# Patient Record
Sex: Female | Born: 2000 | Race: Black or African American | Hispanic: No | Marital: Single | Smoking: Former smoker
Health system: Southern US, Community
[De-identification: ages and names within clinical notes are randomized; demographics above are authoritative.]

## PROBLEM LIST (undated history)

## (undated) DIAGNOSIS — R519 Headache, unspecified: Secondary | ICD-10-CM

## (undated) DIAGNOSIS — O139 Gestational [pregnancy-induced] hypertension without significant proteinuria, unspecified trimester: Secondary | ICD-10-CM

## (undated) DIAGNOSIS — B999 Unspecified infectious disease: Secondary | ICD-10-CM

## (undated) DIAGNOSIS — J069 Acute upper respiratory infection, unspecified: Secondary | ICD-10-CM

## (undated) DIAGNOSIS — O1494 Unspecified pre-eclampsia, complicating childbirth: Secondary | ICD-10-CM

## (undated) HISTORY — DX: Unspecified infectious disease: B99.9

## (undated) HISTORY — DX: Headache, unspecified: R51.9

## (undated) HISTORY — DX: Gestational (pregnancy-induced) hypertension without significant proteinuria, unspecified trimester: O13.9

## (undated) HISTORY — DX: Acute upper respiratory infection, unspecified: J06.9

---
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
## 2018-09-12 ENCOUNTER — Other Ambulatory Visit: Payer: Self-pay | Admitting: Family Medicine

## 2018-09-12 DIAGNOSIS — O99210 Obesity complicating pregnancy, unspecified trimester: Secondary | ICD-10-CM | POA: Insufficient documentation

## 2018-09-12 DIAGNOSIS — O99330 Smoking (tobacco) complicating pregnancy, unspecified trimester: Secondary | ICD-10-CM | POA: Insufficient documentation

## 2018-09-12 DIAGNOSIS — Z3400 Encounter for supervision of normal first pregnancy, unspecified trimester: Secondary | ICD-10-CM | POA: Insufficient documentation

## 2018-09-12 DIAGNOSIS — Z3401 Encounter for supervision of normal first pregnancy, first trimester: Secondary | ICD-10-CM

## 2018-09-12 LAB — OB RESULTS CONSOLE HGB/HCT, BLOOD
HCT: 36 (ref 29–41)
Hemoglobin: 12.4

## 2018-09-12 LAB — SICKLE CELL SCREEN: Sickle Cell Screen: NEGATIVE

## 2018-09-12 LAB — OB RESULTS CONSOLE PLATELET COUNT: Platelets: 361

## 2018-09-12 LAB — OB RESULTS CONSOLE RUBELLA ANTIBODY, IGM: Rubella: IMMUNE

## 2018-09-12 LAB — OB RESULTS CONSOLE HIV ANTIBODY (ROUTINE TESTING): HIV: NONREACTIVE

## 2018-09-12 LAB — OB RESULTS CONSOLE VARICELLA ZOSTER ANTIBODY, IGG: Varicella: NON-IMMUNE/NOT IMMUNE

## 2018-09-13 DIAGNOSIS — O99810 Abnormal glucose complicating pregnancy: Secondary | ICD-10-CM | POA: Insufficient documentation

## 2018-09-13 DIAGNOSIS — O09899 Supervision of other high risk pregnancies, unspecified trimester: Secondary | ICD-10-CM | POA: Insufficient documentation

## 2018-09-13 LAB — OB RESULTS CONSOLE ANTIBODY SCREEN: Antibody Screen: NEGATIVE

## 2018-09-13 LAB — BASIC METABOLIC PANEL: Creatinine: 0.7 (ref <=1.1)

## 2018-09-13 LAB — OB RESULTS CONSOLE ABO/RH: RH Type: POSITIVE

## 2018-09-13 LAB — HEPATIC FUNCTION PANEL: AST: 18 (ref 13–35)

## 2018-09-13 LAB — OB RESULTS CONSOLE RPR: RPR: NONREACTIVE

## 2018-09-13 LAB — OB RESULTS CONSOLE TSH: TSH: 0.464

## 2018-09-13 LAB — OB RESULTS CONSOLE HEPATITIS B SURFACE ANTIGEN: Hepatitis B Surface Ag: NEGATIVE

## 2018-09-18 ENCOUNTER — Ambulatory Visit
Admission: RE | Admit: 2018-09-18 | Discharge: 2018-09-18 | Disposition: A | Discharge status: Home / Self Care | Payer: Medicaid Other | Source: Ambulatory Visit | Attending: Family Medicine | Admitting: Family Medicine

## 2018-09-18 DIAGNOSIS — Z3401 Encounter for supervision of normal first pregnancy, first trimester: Secondary | ICD-10-CM | POA: Diagnosis present

## 2019-02-19 DIAGNOSIS — O99810 Abnormal glucose complicating pregnancy: Secondary | ICD-10-CM

## 2019-02-19 DIAGNOSIS — O09899 Supervision of other high risk pregnancies, unspecified trimester: Secondary | ICD-10-CM

## 2019-02-19 DIAGNOSIS — Z3400 Encounter for supervision of normal first pregnancy, unspecified trimester: Secondary | ICD-10-CM

## 2019-02-19 DIAGNOSIS — O99210 Obesity complicating pregnancy, unspecified trimester: Secondary | ICD-10-CM

## 2019-02-19 DIAGNOSIS — O99330 Smoking (tobacco) complicating pregnancy, unspecified trimester: Secondary | ICD-10-CM

## 2019-02-19 DIAGNOSIS — Z2839 Other underimmunization status: Secondary | ICD-10-CM

## 2019-02-19 LAB — GLUCOSE, 1 HOUR GESTATIONAL: Gestational Diabetes Screen: 136

## 2019-09-26 DIAGNOSIS — Z3490 Encounter for supervision of normal pregnancy, unspecified, unspecified trimester: Secondary | ICD-10-CM | POA: Insufficient documentation

## 2019-09-29 ENCOUNTER — Other Ambulatory Visit: Payer: Self-pay | Admitting: Certified Nurse Midwife

## 2019-09-29 DIAGNOSIS — Z369 Encounter for antenatal screening, unspecified: Secondary | ICD-10-CM

## 2019-10-02 ENCOUNTER — Ambulatory Visit
Admission: RE | Admit: 2019-10-02 | Discharge: 2019-10-02 | Disposition: A | Discharge status: Home / Self Care | Payer: Medicaid Other | Source: Ambulatory Visit | Attending: Maternal and Fetal Medicine | Admitting: Maternal and Fetal Medicine

## 2019-10-02 ENCOUNTER — Other Ambulatory Visit: Payer: Self-pay

## 2019-10-02 DIAGNOSIS — Z369 Encounter for antenatal screening, unspecified: Secondary | ICD-10-CM

## 2019-10-02 DIAGNOSIS — Z87891 Personal history of nicotine dependence: Secondary | ICD-10-CM | POA: Insufficient documentation

## 2019-10-02 DIAGNOSIS — Z3481 Encounter for supervision of other normal pregnancy, first trimester: Secondary | ICD-10-CM | POA: Diagnosis not present

## 2019-10-02 DIAGNOSIS — Z3A13 13 weeks gestation of pregnancy: Secondary | ICD-10-CM | POA: Diagnosis not present

## 2019-10-02 DIAGNOSIS — Z3482 Encounter for supervision of other normal pregnancy, second trimester: Secondary | ICD-10-CM | POA: Diagnosis present

## 2019-10-02 DIAGNOSIS — Z8759 Personal history of other complications of pregnancy, childbirth and the puerperium: Secondary | ICD-10-CM | POA: Insufficient documentation

## 2019-10-02 NOTE — Consult Note (Signed)
[place] Consultation   Chief Complaint: Prior stillbirth at 19 weeks  HPI: Ms. Julie Guzman is a 19 y.o. G2P0100 at 13 weeks by first trimester [place] who presents in consultation for prior stillbirth.  On January 03, 2019, the patient was [redacted]w[redacted]d by her EDD of 04/12/2019. She presented for an ultrasound and had previously had all normal ultrasounds (per pt). She was found to have an IUFD. She was sent to the ED where she was found to be COVID+. She had loss of taste and smell and no appetite but otherwise was feeling well. She does not think any additional testing was done to determine the source of the stillbirth and she has felt that it was due to [place]. The hospital was not able to perform an autopsy due to [place]. She denies any family history of stillbirth or anomalies. She reports that the fetus was only 5.8 oz and there was concern for growth restriction after delivery. She was induced without complication.  Past Medical History: Patient  has no past medical history on file.  Past Surgical History: She  has no past surgical history on file.  Obstetric History:  OB History    Gravida  2   Para      Term      Preterm      AB      Living  0     SAB      TAB      Ectopic      Multiple      Live Births             Medications: PNV gummies  Allergies: Patient has No Known Allergies.  Social History: Patient  reports that she has quit smoking. She has never used smokeless tobacco. She reports previous alcohol use. She reports previous drug use. Drug: Marijuana.  Family History: family history includes Cancer in her maternal grandfather and maternal grandmother; Diabetes in her mother; Heart disease in her paternal grandfather; Seizures in her father.  Review of Systems A full 12 point review of systems was negative or as noted in the History of Present Illness.  Physical Exam: BP (!) 107/52   Pulse (!) 105   Temp 97.9 F (36.6 C)   Wt 92.1 kg   LMP  07/13/2019   BMI 39.65 kg/m   Asessement: Prior stillbirth of unclear etiology. -It is unclear if uncomplicated COVID is associated with stillbirth. This has not been seen in population level studies although the rate of stillbirth is increased among women hospitalized with COVID, presumably due to severe disease.  -I was unable to locate labs today but patient reports that she had cfDNA and glucola done yesterday. We would recommend these tests in addition to RPR/TPPA and T&S. -Additionally today I sent testing for antiphospholipid antibody syndrome. These tests include anticardiolipin antibodies, lupus anticoagulant and beta 2 microglobulin antibodies. If these are positive we would recommend heparin prophylaxis. Our team will follow up on these labs next week. -Given the prior FGR and stillbirth I recommended a bASA today and recommended that she start now. -Recommend detailed anatomy at 18-20 weeks -Recommend growth scan (possibly serial depending on results) in the third trimester. -Delivery timing can be at 39 weeks unless there is an indication earlier.   Total time spent with the patient was 30 minutes with greater than 50% spent in counseling and coordination of care. We appreciate this interesting consult and will be happy to be involved in the  ongoing care of Julie Guzman in anyway her obstetricians desire.  Artemio Aly, MD Maternal-Fetal Medicine [facility]

## 2019-10-03 LAB — BETA-2-GLYCOPROTEIN I ABS, IGG/M/A
Beta-2 Glyco I IgG: <9 GPI IgG units (ref 0–20)
Beta-2-Glycoprotein I IgA: <9 GPI IgA units (ref 0–25)
Beta-2-Glycoprotein I IgM: <9 GPI IgM units (ref 0–32)

## 2019-10-03 LAB — THYROID PANEL WITH TSH
Free Thyroxine Index: 2.3 (ref 1.2–4.9)
T3 Uptake Ratio: 19 % — ABNORMAL LOW (ref 24–39)
T4, Total: 12 ug/dL (ref 4.5–12.0)
TSH: 0.629 u[IU]/mL (ref 0.450–4.500)

## 2019-10-04 LAB — CARDIOLIPIN ANTIBODIES, IGG, IGM, IGA
Anticardiolipin IgA: <9 APL U/mL (ref 0–11)
Anticardiolipin IgG: <9 GPL U/mL (ref 0–14)
Anticardiolipin IgM: <9 MPL U/mL (ref 0–12)

## 2019-10-04 LAB — LUPUS ANTICOAGULANT PANEL
DRVVT: 23.6 s (ref 0.0–47.0)
PTT Lupus Anticoagulant: 31.8 s (ref 0.0–51.9)

## 2019-10-06 NOTE — ED Notes (Signed)
Pt's lab results from [facility] 10/02/19 ordered by Dr. Kizzie Bane, MFM were reviewed on 10/06/19 by Dr. Dolphus Jenny, MFM and signed off that labs were normal.

## 2019-11-10 ENCOUNTER — Other Ambulatory Visit: Payer: Self-pay | Admitting: Obstetrics and Gynecology

## 2019-11-10 DIAGNOSIS — Z8759 Personal history of other complications of pregnancy, childbirth and the puerperium: Secondary | ICD-10-CM

## 2019-11-13 ENCOUNTER — Inpatient Hospital Stay
Admission: RE | Admit: 2019-11-13 | Discharge: 2019-11-13 | Disposition: A | Discharge status: Home / Self Care | Payer: Medicaid Other | Source: Ambulatory Visit

## 2019-11-13 NOTE — Progress Notes (Signed)
Pt was a "No Show" at [facility] in [place] today.

## 2020-12-05 DIAGNOSIS — O1490 Unspecified pre-eclampsia, unspecified trimester: Secondary | ICD-10-CM

## 2021-05-04 ENCOUNTER — Emergency Department: Payer: Medicaid Other

## 2021-05-04 ENCOUNTER — Other Ambulatory Visit: Payer: Self-pay

## 2021-05-04 DIAGNOSIS — R079 Chest pain, unspecified: Secondary | ICD-10-CM | POA: Insufficient documentation

## 2021-05-04 DIAGNOSIS — Z5321 Procedure and treatment not carried out due to patient leaving prior to being seen by health care provider: Secondary | ICD-10-CM | POA: Insufficient documentation

## 2021-05-04 DIAGNOSIS — G43909 Migraine, unspecified, not intractable, without status migrainosus: Secondary | ICD-10-CM | POA: Insufficient documentation

## 2021-05-04 LAB — BASIC METABOLIC PANEL
Anion gap: 8 (ref 5–15)
BUN: 12 mg/dL (ref 6–20)
CO2: 25 mmol/L (ref 22–32)
Calcium: 8.7 mg/dL — ABNORMAL LOW (ref 8.9–10.3)
Chloride: 102 mmol/L (ref 98–111)
Creatinine, Ser: 0.7 mg/dL (ref 0.44–1.00)
GFR, Estimated: >60 mL/min (ref >60)
Glucose, Bld: 95 mg/dL (ref 70–99)
Potassium: 3.4 mmol/L — ABNORMAL LOW (ref 3.5–5.1)
Sodium: 135 mmol/L (ref 135–145)

## 2021-05-04 LAB — CBC
HCT: 37.1 % (ref 36.0–46.0)
Hemoglobin: 11.9 g/dL — ABNORMAL LOW (ref 12.0–15.0)
MCH: 23.7 pg — ABNORMAL LOW (ref 26.0–34.0)
MCHC: 32.1 g/dL (ref 30.0–36.0)
MCV: 73.8 fL — ABNORMAL LOW (ref 80.0–100.0)
Platelets: 468 10*3/uL — ABNORMAL HIGH (ref 150–400)
RBC: 5.03 MIL/uL (ref 3.87–5.11)
RDW: 18.8 % — ABNORMAL HIGH (ref 11.5–15.5)
WBC: 10.5 10*3/uL (ref 4.0–10.5)
nRBC: 0 % (ref 0.0–0.2)

## 2021-05-04 NOTE — ED Triage Notes (Signed)
Pt presents to ER c/o migraine and n/v that started around 3 hrs ago.  Pt also states she had some chest pain that she noticed and said she was dx with a PE after her last pregnancy 4 months ago.  Pt states chest pain starts in her left side of back and shoots to middle of chest.  Pt A&O x4 at this time.  No SOB noted.

## 2021-05-05 ENCOUNTER — Emergency Department
Admission: EM | Admit: 2021-05-05 | Discharge: 2021-05-05 | Disposition: A | Discharge status: Home / Self Care | Payer: Medicaid Other | Attending: Emergency Medicine | Admitting: Emergency Medicine

## 2021-05-05 HISTORY — DX: Unspecified pre-eclampsia, complicating childbirth: O14.94

## 2021-05-05 LAB — TROPONIN I (HIGH SENSITIVITY): Troponin I (High Sensitivity): 7 ng/L (ref <18)

## 2021-08-19 ENCOUNTER — Encounter: Payer: Self-pay | Admitting: Emergency Medicine

## 2021-08-19 ENCOUNTER — Emergency Department
Admission: EM | Admit: 2021-08-19 | Discharge: 2021-08-19 | Disposition: A | Discharge status: Home / Self Care | Payer: Medicaid Other | Attending: Emergency Medicine | Admitting: Emergency Medicine

## 2021-08-19 ENCOUNTER — Other Ambulatory Visit: Payer: Self-pay

## 2021-08-19 DIAGNOSIS — K0520 Aggressive periodontitis, unspecified: Secondary | ICD-10-CM | POA: Diagnosis not present

## 2021-08-19 DIAGNOSIS — K0889 Other specified disorders of teeth and supporting structures: Secondary | ICD-10-CM | POA: Diagnosis present

## 2021-08-19 MED ORDER — AMOXICILLIN 500 MG PO CAPS
500.0000 mg | ORAL_CAPSULE | Freq: Once | ORAL | Status: AC
  Administered 2021-08-19: 500 mg via ORAL
  Filled 2021-08-19: qty 1

## 2021-08-19 MED ORDER — LIDOCAINE VISCOUS HCL 2 % MT SOLN
15.0000 mL | Freq: Once | OROMUCOSAL | Status: AC
  Administered 2021-08-19: 15 mL via OROMUCOSAL
  Filled 2021-08-19: qty 15

## 2021-08-19 MED ORDER — AMOXICILLIN 500 MG PO CAPS: 500.0000 mg | ORAL_CAPSULE | Freq: Three times a day (TID) | ORAL | 0 refills | Status: DC

## 2021-08-19 NOTE — ED Provider Notes (Signed)
[facility] Provider Note  Patient Contact: 1:04 PM (approximate)   History   Dental Pain   HPI  Julie Guzman is a 21 y.o. female  presents to the ED for evaluation of pain to the lower gum near her left lower molar. She notes pain and an ulcer, making it difficult to fully open her jaw. She denies fevers, chills, or purulent drainage.   Physical Exam   Triage Vital Signs: ED Triage Vitals  Enc Vitals Group     BP 08/19/21 1202 (!) 142/88     Pulse Rate 08/19/21 1202 78     Resp 08/19/21 1202 18     Temp 08/19/21 1202 98.3 F (36.8 C)     Temp Source 08/19/21 1202 Oral     SpO2 08/19/21 1202 100 %     Weight 08/19/21 1134 199 lb 15.3 oz (90.7 kg)     Height 08/19/21 1134 5' (1.524 m)     Head Circumference --      Peak Flow --      Pain Score 08/19/21 1133 7     Pain Loc --      Pain Edu? --      Excl. in GC? --     Most recent vital signs: Vitals:   08/19/21 1202  BP: (!) 142/88  Pulse: 78  Resp: 18  Temp: 98.3 F (36.8 C)  SpO2: 100%     General: Alert and in no acute distress. Ears:  Nose: No congestion/rhinnorhea. Mouth/Throat: Mucous membranes are moist. Uvula is midline and tonsils are flat. Left upper molar buccal fracture/cavity with some local gum edema. Gum erosion noted to the angle of the buccal jaw.  Hematological/Lymphatic/Immunilogical: No cervical lymphadenopathy. Cardiovascular:  Good peripheral perfusion Respiratory: Normal respiratory effort without tachypnea or retractions. Lungs CTAB.   Musculoskeletal: Full range of motion to all extremities.  Neurologic:  No gross focal neurologic deficits are appreciated.  Skin:   No rash noted Other:   ED Results / Procedures / Treatments   Labs (all labs ordered are listed, but only abnormal results are displayed) Labs Reviewed - No data to display   EKG    RADIOLOGY  No results found.  PROCEDURES:  Critical Care performed:  No  Procedures   MEDICATIONS ORDERED IN ED: Medications  amoxicillin (AMOXIL) capsule 500 mg (has no administration in time range)  lidocaine (XYLOCAINE) 2 % viscous mouth solution 15 mL (15 mLs Mouth/Throat Given 08/19/21 1326)     IMPRESSION / MDM / ASSESSMENT AND PLAN / ED COURSE  I reviewed the triage vital signs and the nursing notes.                              Differential diagnosis includes, but is not limited to, dental caries, dental abscess, pericoronitis  Patient's diagnosis is consistent with pericorinitis. Patient will be discharged home with prescriptions for amoxicillin. Patient is to follow up with a local dental provider as needed or otherwise directed. Patient is given ED precautions to return to the ED for any worsening or new symptoms.   FINAL CLINICAL IMPRESSION(S) / ED DIAGNOSES   Final diagnoses:  Acute pericoronitis     Rx / DC Orders   ED Discharge Orders          Ordered    amoxicillin (AMOXIL) 500 MG capsule  3 times daily        08/19/21 1317  Note:  This document was prepared using Dragon voice recognition software and may include unintentional dictation errors.    Lissa Hoard, PA-C 08/19/21 2007    Arnaldo Natal, MD 08/20/21 [phone]

## 2021-08-19 NOTE — ED Triage Notes (Signed)
Dental pain.  States wisdom teeth are cutting into jaw.

## 2021-08-19 NOTE — Discharge Instructions (Addendum)
Take the antibiotic as directed. Rinse with warm-salty water after every meal. Follow-up with one of the dental clinics listed below.  OPTIONS FOR DENTAL FOLLOW UP CARE  [place] [facility] - Local Safety Net Dental Clinics [email or web address].htm   [facility] [phone])  [facility] [phone])  [place] [phone] ext [phone])  [facility] [phone])  [facility] [phone]) This clinic caters to the indigent population and is on a lottery system. Location: [employer] [facility], [employer], [address] [place] Clinic Hours: Wednesdays from 6pm - 9pm, patients seen by a lottery system. For dates, call or go to [email or web address] Services: Cleanings, fillings and simple extractions. Payment Options: DENTAL WORK IS FREE OF CHARGE. Bring proof of income or support. Best way to get seen: Arrive at 5:15 pm - this is a lottery, NOT first come/first serve, so arriving earlier will not increase your chances of being seen.     [facility] [phone] Select option 1 for emergencies   Location: [facility], [place], [address], [place] Clinic Hours: No walk-ins accepted - call the day before to schedule an appointment. Check in times are 9:30 am and 1:30 pm. Services: Simple extractions, temporary fillings, pulpectomy/pulp debridement, uncomplicated abscess drainage. Payment Options: PAYMENT IS DUE AT THE TIME OF SERVICE.  Fee is usually $100-200, additional surgical procedures (e.g. abscess drainage) may be extra. Cash, checks, Visa/MasterCard accepted.  Can file Medicaid if patient is covered for dental - patient should call case worker to check. No discount for [facility] patients. Best way to get seen: MUST call the day before and get onto  the schedule. Can usually be seen the next 1-2 days. No walk-ins accepted.     [facility] [phone]   Location: [facility], [address] [place] Clinic Hours: M, W, Th, F 8am or 1:30pm, Tues 9a or 1:30 - first come/first served. Services: Simple extractions, temporary fillings, uncomplicated abscess drainage.  You do not need to be an [facility] resident. Payment Options: PAYMENT IS DUE AT THE TIME OF SERVICE. Dental insurance, otherwise sliding scale - bring proof of income or support. Depending on income and treatment needed, cost is usually $50-200. Best way to get seen: Arrive early as it is first come/first served.     [facility] [phone]   Location: [address] Clinic Hours: Mon-Thu 8a-5p Services: Most basic dental services including extractions and fillings. Payment Options: PAYMENT IS DUE AT THE TIME OF SERVICE. Sliding scale, up to 50% off - bring proof if income or support. Medicaid with dental option accepted. Best way to get seen: Call to schedule an appointment, can usually be seen within 2 weeks OR they will try to see walk-ins - show up at 8a or 2p (you may have to wait).     [facility] [phone] [place] RESIDENTS ONLY   Location: [facility], [address] [place], [place] [address] Clinic Hours: By appointment only. Monday - Thursday 8am-5pm, Friday 8am-12pm Services: Cleanings, fillings, extractions. Payment Options: PAYMENT IS DUE AT THE TIME OF SERVICE. Cash, Visa or MasterCard. Sliding scale - $30 minimum per service. Best way to get seen: Come in to office, complete packet and make an appointment - need proof of income or support monies for each household member and proof of [facility] residence. Usually takes  about a month to get in.     [facility] [phone]    Location: [address] [place] Clinic Hours: Walk-in Urgent Care Dental Services are offered Monday-Friday mornings only. The numbers of emergencies accepted daily is limited to the number of providers available. Maximum 15 - Mondays, Wednesdays & Thursdays Maximum 10 - Tuesdays & Fridays Services: You do not need to be a [facility] resident to be seen for a dental emergency. Emergencies are defined as pain, swelling, abnormal bleeding, or dental trauma. Walkins will receive x-rays if needed. NOTE: Dental cleaning is not an emergency. Payment Options: PAYMENT IS DUE AT THE TIME OF SERVICE. Minimum co-pay is $40.00 for uninsured patients. Minimum co-pay is $3.00 for Medicaid with dental coverage. Dental Insurance is accepted and must be presented at time of visit. Medicare does not cover dental. Forms of payment: Cash, credit card, checks. Best way to get seen: If not previously registered with the clinic, walk-in dental registration begins at 7:15 am and is on a first come/first serve basis. If previously registered with the clinic, call to make an appointment.     [facility] [place] ONLY   Location: [address] [place], [place] Clinic Hours: Mon-Thu 10a-2p Services: Extractions only! Payment Options: FREE (donations accepted) - bring proof of income or support Best way to get seen: Call and schedule an appointment OR come at 8am on the 1st Monday of every month (except for holidays) when it is first come/first served.     [facility] [phone]   Location: [place], [place] Clinic Hours: Friday mornings Services, Payment Options, Best way to get seen: Call for info

## 2021-08-19 NOTE — ED Notes (Signed)
Pt to ED for tooth pain, top L wisdom tooth has cavity. States pain is radiating to ear. Husband at bedside holding their baby.

## 2021-12-05 ENCOUNTER — Emergency Department
Admission: EM | Admit: 2021-12-05 | Discharge: 2021-12-05 | Disposition: A | Discharge status: Home / Self Care | Payer: Medicaid Other | Attending: Emergency Medicine | Admitting: Emergency Medicine

## 2021-12-05 ENCOUNTER — Emergency Department: Payer: Medicaid Other

## 2021-12-05 ENCOUNTER — Other Ambulatory Visit: Payer: Self-pay

## 2021-12-05 ENCOUNTER — Encounter: Payer: Self-pay | Admitting: Emergency Medicine

## 2021-12-05 DIAGNOSIS — Z3A00 Weeks of gestation of pregnancy not specified: Secondary | ICD-10-CM | POA: Insufficient documentation

## 2021-12-05 DIAGNOSIS — R102 Pelvic and perineal pain: Secondary | ICD-10-CM

## 2021-12-05 DIAGNOSIS — O26899 Other specified pregnancy related conditions, unspecified trimester: Secondary | ICD-10-CM | POA: Insufficient documentation

## 2021-12-05 DIAGNOSIS — R1030 Lower abdominal pain, unspecified: Secondary | ICD-10-CM | POA: Insufficient documentation

## 2021-12-05 DIAGNOSIS — N912 Amenorrhea, unspecified: Secondary | ICD-10-CM | POA: Insufficient documentation

## 2021-12-05 DIAGNOSIS — R519 Headache, unspecified: Secondary | ICD-10-CM | POA: Diagnosis not present

## 2021-12-05 DIAGNOSIS — O0289 Other abnormal products of conception: Secondary | ICD-10-CM | POA: Diagnosis not present

## 2021-12-05 DIAGNOSIS — O3680X0 Pregnancy with inconclusive fetal viability, not applicable or unspecified: Secondary | ICD-10-CM

## 2021-12-05 LAB — CBC
HCT: 38.1 % (ref 36.0–46.0)
Hemoglobin: 12.3 g/dL (ref 12.0–15.0)
MCH: 26.1 pg (ref 26.0–34.0)
MCHC: 32.3 g/dL (ref 30.0–36.0)
MCV: 80.7 fL (ref 80.0–100.0)
Platelets: 399 10*3/uL (ref 150–400)
RBC: 4.72 MIL/uL (ref 3.87–5.11)
RDW: 17.4 % — ABNORMAL HIGH (ref 11.5–15.5)
WBC: 11 10*3/uL — ABNORMAL HIGH (ref 4.0–10.5)
nRBC: 0 % (ref 0.0–0.2)

## 2021-12-05 LAB — POC URINE PREG, ED: Preg Test, Ur: NEGATIVE

## 2021-12-05 LAB — URINALYSIS, ROUTINE W REFLEX MICROSCOPIC
Bilirubin Urine: NEGATIVE
Glucose, UA: NEGATIVE mg/dL
Hgb urine dipstick: NEGATIVE
Ketones, ur: NEGATIVE mg/dL
Leukocytes,Ua: NEGATIVE
Nitrite: NEGATIVE
Protein, ur: NEGATIVE mg/dL
Specific Gravity, Urine: 1.02 (ref 1.005–1.030)
pH: 5 (ref 5.0–8.0)

## 2021-12-05 LAB — BASIC METABOLIC PANEL
Anion gap: 8 (ref 5–15)
BUN: 8 mg/dL (ref 6–20)
CO2: 22 mmol/L (ref 22–32)
Calcium: 8.8 mg/dL — ABNORMAL LOW (ref 8.9–10.3)
Chloride: 105 mmol/L (ref 98–111)
Creatinine, Ser: 0.69 mg/dL (ref 0.44–1.00)
GFR, Estimated: >60 mL/min (ref >60)
Glucose, Bld: 121 mg/dL — ABNORMAL HIGH (ref 70–99)
Potassium: 4 mmol/L (ref 3.5–5.1)
Sodium: 135 mmol/L (ref 135–145)

## 2021-12-05 LAB — HCG, QUANTITATIVE, PREGNANCY: hCG, Beta Chain, Quant, S: 80 m[IU]/mL — ABNORMAL HIGH (ref <5)

## 2021-12-05 NOTE — Discharge Instructions (Addendum)
Your urine pregnancy test was negative however your quantitative pregnancy hormone level which is a blood test was elevated at 80.  We did not see any signs of pregnancy on your ultrasound.  This either means that you are very early on in pregnancy or that you may have an abnormal pregnancy. ? ?Please follow-up with either her primary care doctor or gynecologist to have repeat blood levels checked in the next several days to see whether they are trending up or trending down. ?

## 2021-12-05 NOTE — ED Triage Notes (Signed)
Pt via POV from home. Pt thinks she may be pregnant. LMP 3/23. Pt c/o abd cramping intermittently. Denies any vaginal bleeding. Pt is A&Ox4 and NAD ?

## 2021-12-05 NOTE — ED Provider Triage Note (Signed)
Emergency Medicine Provider Triage Evaluation Note ? ?Rulon Eisenmenger, a 21 y.o. female  was evaluated in triage.  Pt complains of amenorrhea and possible pregnancy.  She reports her LMP was 3/23.  Patient also reports of intermittent abdominal discomfort and cramping.  Denies any vaginal bleeding or dysuria. ? ?Review of Systems  ?Positive: Amenorrhea, abd cramping ?Negative: Vaginal bleeding, NVD ? ?Physical Exam  ?BP (!) 145/98   Pulse 81   Temp 98.1 ?F (36.7 ?C)   Resp 20   Ht 5' (1.524 m)   Wt 99.8 kg   LMP 10/20/2021   SpO2 100%   BMI 42.97 kg/m?  ?Gen:   Awake, no distress  NAD ?Resp:  Normal effort  ?MSK:   Moves extremities without difficulty  ?ABD:  Soft, nontender ? ?Medical Decision Making  ?Medically screening exam initiated at 3:15 PM.  Appropriate orders placed.  ILISA HAYWORTH was informed that the remainder of the evaluation will be completed by another provider, this initial triage assessment does not replace that evaluation, and the importance of remaining in the ED until their evaluation is complete. ? ?Patient to the ED with amenorrhea and concern for possible pregnancy patient also reports abdominal cramping intermittently. ?  ?Lissa Hoard, PA-C ?12/05/21 1516 ? ?

## 2021-12-05 NOTE — ED Provider Notes (Signed)
? ?[facility] ?Provider Note ? ? ? Event Date/Time  ? First MD Initiated Contact with Patient 12/05/21 1548   ?  (approximate) ? ? ?History  ? ?Possible Pregnancy ? ? ?HPI ? ?Julie Guzman is a 21 y.o. female  with pmh preeclampsia presents with concern for possible pregnancy.  Patient has not had a menstrual period since March.  She endorses intermittent lower abdominal cramping as well as breast tenderness and intermittent headaches.  Says that this feels similar to when she was pregnant in the past.  When she was pregnant with her son she had a negative urine pregnancy test but then several days later was told that she was [redacted] weeks pregnant.  She denies fevers chills or urinary symptoms.   ? ?  ? ?Past Medical History:  ?Diagnosis Date  ? Pre-eclampsia affecting childbirth   ? ? ?Patient Active Problem List  ? Diagnosis Date Noted  ? Abnormal maternal glucose tolerance, antepartum 09/13/2018  ? Maternal varicella, non-immune 09/13/2018  ? Supervision of normal first pregnancy, antepartum 09/12/2018  ? Obesity in pregnancy, antepartum 09/12/2018  ? 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 ? ? ?Physical Exam  ?Triage Vital Signs: ?ED Triage Vitals  ?Enc Vitals Group  ?   BP 12/05/21 1509 (!) 145/98  ?   Pulse Rate 12/05/21 1509 81  ?   Resp 12/05/21 1509 20  ?   Temp 12/05/21 1509 98.1 ?F (36.7 ?C)  ?   Temp src --   ?   SpO2 12/05/21 1509 100 %  ?   Weight 12/05/21 1507 220 lb (99.8 kg)  ?   Height 12/05/21 1507 5' (1.524 m)  ?   Head Circumference --   ?   Peak Flow --   ?   Pain Score 12/05/21 1507 7  ?   Pain Loc --   ?   Pain Edu? --   ?   Excl. in GC? --   ? ? ?Most recent vital signs: ?Vitals:  ? 12/05/21 1509 12/05/21 1751  ?BP: (!) 145/98 140/90  ?Pulse: 81 78  ?Resp: 20 18  ?Temp: 98.1 ?F (36.7 ?C)   ?SpO2: 100% 99%  ? ? ? ?General: Awake, no distress.  ?CV:  Good peripheral perfusion.  ?Resp:  Normal effort.  ?Abd:  No distention.  No abdominal tenderness ?Neuro:              Awake, Alert, Oriented x 3  ?Other:   ? ? ?ED Results / Procedures / Treatments  ?Labs ?(all labs ordered are listed, but only abnormal results are displayed) ?Labs Reviewed  ?CBC - Abnormal; Notable for the following components:  ?    Result Value  ? WBC 11.0 (*)   ? RDW 17.4 (*)   ? All other components within normal limits  ?BASIC METABOLIC PANEL - Abnormal; Notable for the following components:  ? Glucose, Bld 121 (*)   ? Calcium 8.8 (*)   ? All other components within normal limits  ?URINALYSIS, ROUTINE W REFLEX MICROSCOPIC - Abnormal; Notable for the following components:  ? Color, Urine YELLOW (*)   ? APPearance HAZY (*)   ? All other components within normal limits  ?HCG, QUANTITATIVE, PREGNANCY - Abnormal; Notable for the following components:  ? hCG, Beta Chain, Quant, S 80 (*)   ? All other components within normal limits  ?POC URINE PREG, ED  ? ? ? ?EKG ? ? ? ? ?RADIOLOGY ?I  reviewed the ultrasound of the pelvis which does not show any gestational sac ? ?PROCEDURES: ? ?Critical Care performed: No ? ?Procedures ? ?The patient is on the cardiac monitor to evaluate for evidence of arrhythmia and/or significant heart rate changes. ? ? ?MEDICATIONS ORDERED IN ED: ?Medications - No data to display ? ? ?IMPRESSION / MDM / ASSESSMENT AND PLAN / ED COURSE  ?I reviewed the triage vital signs and the nursing notes. ?             ?               ? ?Differential diagnosis includes, but is not limited to, early pregnancy, ectopic pregnancy, secondary amenorrhea ? ?Patient is a 21 year old female who presents with amenorrhea x3 months as well as intermittent abdominal cramping headaches.  Patient feels similar to when she was pregnant.  Vital signs are notable for hypertension but otherwise within normal limits.  Patient appears well abdomen is nontender.  Neurologic exam is intact.  Her urine pregnancy test negative.  However because of patient's symptoms and the fact that she had similar story in the past with  negative urine pregnancy and positive blood test I did order of beta quant which is 80.  Somewhat unusual that the U preg would be negative.  Given concern for possible early pregnancy versus ectopic we will obtain a first trimester ultrasound. ? ?The ultrasound does not show any gestational sac.  Had a discussion with the patient that this had a means that she is very early on in pregnancy or maybe has a pregnancy in an abnormal location.  Stressed the importance of follow-up within the next several days to have a repeat quant hCG to see the trend.  Patient understood. ? ? ?FINAL CLINICAL IMPRESSION(S) / ED DIAGNOSES  ? ?Final diagnoses:  ?Amenorrhea  ?Pregnancy of unknown anatomic location  ? ? ? ?Rx / DC Orders  ? ?ED Discharge Orders   ? ? None  ? ?  ? ? ? ?Note:  This document was prepared using Dragon voice recognition software and may include unintentional dictation errors. ?  ?Georga Hacking, MD ?12/05/21 1753 ? ?

## 2021-12-07 ENCOUNTER — Encounter: Payer: Self-pay | Admitting: Emergency Medicine

## 2021-12-07 ENCOUNTER — Emergency Department: Payer: Medicaid Other

## 2021-12-07 ENCOUNTER — Emergency Department
Admission: EM | Admit: 2021-12-07 | Discharge: 2021-12-07 | Disposition: A | Discharge status: Home / Self Care | Payer: Medicaid Other | Attending: Emergency Medicine | Admitting: Emergency Medicine

## 2021-12-07 ENCOUNTER — Other Ambulatory Visit: Payer: Self-pay

## 2021-12-07 DIAGNOSIS — O26891 Other specified pregnancy related conditions, first trimester: Secondary | ICD-10-CM | POA: Insufficient documentation

## 2021-12-07 DIAGNOSIS — Z3491 Encounter for supervision of normal pregnancy, unspecified, first trimester: Secondary | ICD-10-CM

## 2021-12-07 DIAGNOSIS — M79662 Pain in left lower leg: Secondary | ICD-10-CM | POA: Insufficient documentation

## 2021-12-07 LAB — POC URINE PREG, ED: Preg Test, Ur: POSITIVE — AB

## 2021-12-07 LAB — HCG, QUANTITATIVE, PREGNANCY: hCG, Beta Chain, Quant, S: 220 m[IU]/mL — ABNORMAL HIGH (ref <5)

## 2021-12-07 LAB — BASIC METABOLIC PANEL
Anion gap: 9 (ref 5–15)
BUN: 10 mg/dL (ref 6–20)
CO2: 23 mmol/L (ref 22–32)
Calcium: 9.1 mg/dL (ref 8.9–10.3)
Chloride: 104 mmol/L (ref 98–111)
Creatinine, Ser: 0.68 mg/dL (ref 0.44–1.00)
GFR, Estimated: >60 mL/min (ref >60)
Glucose, Bld: 110 mg/dL — ABNORMAL HIGH (ref 70–99)
Potassium: 3.8 mmol/L (ref 3.5–5.1)
Sodium: 136 mmol/L (ref 135–145)

## 2021-12-07 LAB — D-DIMER, QUANTITATIVE: D-Dimer, Quant: 0.36 ug/mL-FEU (ref 0.00–0.50)

## 2021-12-07 LAB — TROPONIN I (HIGH SENSITIVITY): Troponin I (High Sensitivity): <2 ng/L (ref <18)

## 2021-12-07 NOTE — ED Provider Notes (Signed)
? ?[facility] ?Provider Note ? ? ? Event Date/Time  ? First MD Initiated Contact with Patient 12/07/21 1806   ?  (approximate) ? ? ?History  ? ?repeat labs ? ? ?HPI ? ?Julie Guzman is a 21 y.o. female with past medical history of preeclampsia presents for repeat beta-hCG.  I actually saw this patient 2 days ago she presented with amenorrhea and some intermittent abdominal cramping.  Beta-hCG at that time was 80 although she had a negative urine pregnancy test.  Ultrasound did not show any sign of pregnancy.  Patient continues to have some lower abdominal cramping.  She is also complaining of more dyspnea on exertion and with bending down and intermittent sharp left-sided chest pain.  She also complains of pain in the left leg no swelling.  She has a history of blood clots in her lungs during pregnancy.  Had been on a blood thinner but is not on it currently, she recently moved here and so has not followed up with a primary care doctor.  Denies cough fevers chills no urinary symptoms no nausea vomiting. ? ?  ? ?Past Medical History:  ?Diagnosis Date  ? Pre-eclampsia affecting childbirth   ? ? ?Patient Active Problem List  ? Diagnosis Date Noted  ? Abnormal maternal glucose tolerance, antepartum 09/13/2018  ? Maternal varicella, non-immune 09/13/2018  ? Supervision of normal first pregnancy, antepartum 09/12/2018  ? Obesity in pregnancy, antepartum 09/12/2018  ? 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 ? ? ?Physical Exam  ?Triage Vital Signs: ?ED Triage Vitals  ?Enc Vitals Group  ?   BP 12/07/21 1751 (!) 166/119  ?   Pulse Rate 12/07/21 1751 66  ?   Resp 12/07/21 1751 18  ?   Temp 12/07/21 1751 98.4 ?F (36.9 ?C)  ?   Temp Source 12/07/21 1751 Oral  ?   SpO2 12/07/21 1751 100 %  ?   Weight 12/07/21 1746 220 lb 0.3 oz (99.8 kg)  ?   Height 12/07/21 1746 5' (1.524 m)  ?   Head Circumference --   ?   Peak Flow --   ?   Pain Score 12/07/21 1746 0  ?   Pain Loc --   ?   Pain Edu? --   ?    Excl. in GC? --   ? ? ?Most recent vital signs: ?Vitals:  ? 12/07/21 1751  ?BP: (!) 166/119  ?Pulse: 66  ?Resp: 18  ?Temp: 98.4 ?F (36.9 ?C)  ?SpO2: 100%  ? ? ? ?General: Awake, no distress.  ?CV:  Good peripheral perfusion.  No significant swelling or asymmetry ?Resp:  Normal effort.  Lungs are clear ?Abd:  No distention.  ?Neuro:             Awake, Alert, Oriented x 3  ?Other:   ? ? ?ED Results / Procedures / Treatments  ?Labs ?(all labs ordered are listed, but only abnormal results are displayed) ?Labs Reviewed  ?HCG, QUANTITATIVE, PREGNANCY - Abnormal; Notable for the following components:  ?    Result Value  ? hCG, Beta Chain, Quant, S 220 (*)   ? All other components within normal limits  ?BASIC METABOLIC PANEL - Abnormal; Notable for the following components:  ? Glucose, Bld 110 (*)   ? All other components within normal limits  ?POC URINE PREG, ED - Abnormal; Notable for the following components:  ? Preg Test, Ur Positive (*)   ? All other components within  normal limits  ?D-DIMER, QUANTITATIVE  ?TROPONIN I (HIGH SENSITIVITY)  ? ? ? ?EKG ? ?EKG interpreted by myself shows normal sinus rhythm with a normal axis normal intervals no acute ischemic changes, LVH ? ? ?RADIOLOGY ?I reviewed the CXR which does not show any acute cardiopulmonary process; agree with radiology report  ? ? ? ?PROCEDURES: ? ?Critical Care performed: No ? ?Procedures ? ?The patient is on the cardiac monitor to evaluate for evidence of arrhythmia and/or significant heart rate changes. ? ? ?MEDICATIONS ORDERED IN ED: ?Medications - No data to display ? ? ?IMPRESSION / MDM / ASSESSMENT AND PLAN / ED COURSE  ?I reviewed the triage vital signs and the nursing notes. ?             ?               ? ?Differential diagnosis includes, but is not limited to, early pregnancy, DVT, pulmonary embolism ? ?Patient is a 21 year old female presents today primarily for repeat hCG.  Was done 2 days ago in the ED was 80 with negative ultrasound patient had  some abdominal cramping at that time.  She also secondarily complains of dyspnea and chest pain and leg pain.  Has a history of DVT/PE in pregnancy with her son is not anticoagulated.  Also has a history of a stillborn and third trimester miscarriage question whether she has underlying antiphospholipid syndrome.  Vital signs within normal limits she is well-appearing no objective evidence of DVT on exam.  We will repeat the hCG but I also feel that she needs work-up for PE.  Given she is early on in pregnancy potentially we will send a D-dimer also obtain bilateral DVT studies check troponin and EKG.  Ultimately may need a CTA. ? ?Patient's troponin is negative her D-dimer is negative although D-dimer is not validated for pregnancy I think that given she is in the first trimester and that it is within normal range that PE/DVT would be unlikely and she is still low risk by Wells criteria so I do think that we can use a D-dimer in this case.  Beta-hCG is 220 which is than doubled from 2 days ago.  She is not having any significant abdominal pain to suggest ectopic pregnancy I think we can hold off on obtaining another ultrasound since this was just done 2 days ago. ? ?  ? ? ?FINAL CLINICAL IMPRESSION(S) / ED DIAGNOSES  ? ?Final diagnoses:  ?First trimester pregnancy  ? ? ? ?Rx / DC Orders  ? ?ED Discharge Orders   ? ? None  ? ?  ? ? ? ?Note:  This document was prepared using Dragon voice recognition software and may include unintentional dictation errors. ?  ?Georga Hacking, MD ?12/07/21 2059 ? ?

## 2021-12-07 NOTE — ED Triage Notes (Signed)
Arrives for repeat [facility] ? ?AAOx3. Skin warm and dry. NAD ?

## 2021-12-07 NOTE — Discharge Instructions (Signed)
Your pregnancy hormone was 220 which is increased from 80, 2 days ago.  It is likely that you are very early on in your first trimester.  Please follow-up with your OB/GYN.  If you develop significant abdominal pain or vaginal bleeding please return to the emergency department, because we still do not know for sure that your pregnancy is in the uterus and not in the fallopian tube which would be an ectopic pregnancy. ? ?Your chest x-ray blood work EKG were all reassuring.  The screen for blood clots was negative making it unlikely that you have a blood clot in your lung.  Your ultrasound of your legs did not show any blood clot in your legs. ?

## 2022-01-10 ENCOUNTER — Emergency Department
Admission: EM | Admit: 2022-01-10 | Discharge: 2022-01-10 | Disposition: A | Discharge status: Home / Self Care | Payer: Medicaid Other | Attending: Student in an Organized Health Care Education/Training Program | Admitting: Student in an Organized Health Care Education/Training Program

## 2022-01-10 ENCOUNTER — Emergency Department: Payer: Medicaid Other

## 2022-01-10 ENCOUNTER — Other Ambulatory Visit: Payer: Self-pay

## 2022-01-10 ENCOUNTER — Encounter: Payer: Self-pay | Admitting: Emergency Medicine

## 2022-01-10 DIAGNOSIS — O26891 Other specified pregnancy related conditions, first trimester: Secondary | ICD-10-CM | POA: Insufficient documentation

## 2022-01-10 DIAGNOSIS — N9489 Other specified conditions associated with female genital organs and menstrual cycle: Secondary | ICD-10-CM | POA: Diagnosis not present

## 2022-01-10 DIAGNOSIS — O4690 Antepartum hemorrhage, unspecified, unspecified trimester: Secondary | ICD-10-CM

## 2022-01-10 DIAGNOSIS — O209 Hemorrhage in early pregnancy, unspecified: Secondary | ICD-10-CM | POA: Insufficient documentation

## 2022-01-10 DIAGNOSIS — Z3A09 9 weeks gestation of pregnancy: Secondary | ICD-10-CM | POA: Insufficient documentation

## 2022-01-10 LAB — CBC
HCT: 35.2 % — ABNORMAL LOW (ref 36.0–46.0)
Hemoglobin: 11.7 g/dL — ABNORMAL LOW (ref 12.0–15.0)
MCH: 26.8 pg (ref 26.0–34.0)
MCHC: 33.2 g/dL (ref 30.0–36.0)
MCV: 80.5 fL (ref 80.0–100.0)
Platelets: 378 10*3/uL (ref 150–400)
RBC: 4.37 MIL/uL (ref 3.87–5.11)
RDW: 16.4 % — ABNORMAL HIGH (ref 11.5–15.5)
WBC: 10 10*3/uL (ref 4.0–10.5)
nRBC: 0 % (ref 0.0–0.2)

## 2022-01-10 LAB — BASIC METABOLIC PANEL
Anion gap: 8 (ref 5–15)
BUN: 6 mg/dL (ref 6–20)
CO2: 20 mmol/L — ABNORMAL LOW (ref 22–32)
Calcium: 8.8 mg/dL — ABNORMAL LOW (ref 8.9–10.3)
Chloride: 106 mmol/L (ref 98–111)
Creatinine, Ser: 0.73 mg/dL (ref 0.44–1.00)
GFR, Estimated: >60 mL/min (ref >60)
Glucose, Bld: 101 mg/dL — ABNORMAL HIGH (ref 70–99)
Potassium: 4 mmol/L (ref 3.5–5.1)
Sodium: 134 mmol/L — ABNORMAL LOW (ref 135–145)

## 2022-01-10 LAB — ABO/RH: ABO/RH(D): O POS

## 2022-01-10 LAB — POC URINE PREG, ED: Preg Test, Ur: POSITIVE — AB

## 2022-01-10 LAB — HCG, QUANTITATIVE, PREGNANCY: hCG, Beta Chain, Quant, S: 78803 m[IU]/mL — ABNORMAL HIGH (ref <5)

## 2022-01-10 NOTE — ED Notes (Signed)
See triage note  presents with some vaginal bleeding   states she noticed bleeding after intercourse this am when she used the bathroom  slight cramping

## 2022-01-10 NOTE — ED Notes (Signed)
States pain is sharp in nature at present

## 2022-01-10 NOTE — ED Triage Notes (Signed)
Pt here with vaginal bleeding today. Pt states she just found out she is pregnant and came because she started having some "period-like" bleeding. Pt having cramps. Pt ambulatory to triage.

## 2022-01-10 NOTE — Discharge Instructions (Addendum)
Follow-up with your OBGYN for repeat Ultrasound in 1-2 weeks  I'd recommend no heavy lifting, no strenuous exercise, and no intercourse until follow-up  Continue your prenatal vitamins  Drink plenty of fluids

## 2022-01-10 NOTE — ED Provider Notes (Signed)
[facility] Provider Note    Event Date/Time   First MD Initiated Contact with Patient 01/10/22 1240     (approximate)   History   Vaginal Bleeding   HPI  Julie Guzman is a 21 y.o. female  G3P0 presents to the ER for evaluation of vaginal bleeding in early pregnancy.  She is uncertain as to how far along she is.  States that her having intercourse this morning started noticing some vaginal bleeding afterwards.  Does have some mild pelvic discomfort.  Denies any discharge no fevers.  Has not had follow-up with her provider yet.  Uncertain as to her blood type     Physical Exam   Triage Vital Signs: ED Triage Vitals [01/10/22 1229]  Enc Vitals Group     BP 111/77     Pulse Rate 100     Resp 18     Temp 98.3 F (36.8 C)     Temp Source Oral     SpO2 96 %     Weight 220 lb 0.3 oz (99.8 kg)     Height 5' (1.524 m)     Head Circumference      Peak Flow      Pain Score 6     Pain Loc      Pain Edu?      Excl. in GC?     Most recent vital signs: Vitals:   01/10/22 1229  BP: 111/77  Pulse: 100  Resp: 18  Temp: 98.3 F (36.8 C)  SpO2: 96%     Constitutional: Alert  Eyes: Conjunctivae are normal.  Head: Atraumatic. Nose: No congestion/rhinnorhea. Mouth/Throat: Mucous membranes are moist.   Neck: Painless ROM.  Cardiovascular:   Good peripheral circulation. Respiratory: Normal respiratory effort.  No retractions.  Gastrointestinal: Soft and nontender.  Musculoskeletal:  no deformity Neurologic:  MAE spontaneously. No gross focal neurologic deficits are appreciated.  Skin:  Skin is warm, dry and intact. No rash noted. Psychiatric: Mood and affect are normal. Speech and behavior are normal.    ED Results / Procedures / Treatments   Labs (all labs ordered are listed, but only abnormal results are displayed) Labs Reviewed  CBC - Abnormal; Notable for the following components:      Result Value   Hemoglobin 11.7 (*)    HCT 35.2  (*)    RDW 16.4 (*)    All other components within normal limits  BASIC METABOLIC PANEL - Abnormal; Notable for the following components:   Sodium 134 (*)    CO2 20 (*)    Glucose, Bld 101 (*)    Calcium 8.8 (*)    All other components within normal limits  HCG, QUANTITATIVE, PREGNANCY - Abnormal; Notable for the following components:   hCG, Beta Chain, Quant, S [id number] (*)    All other components within normal limits  POC URINE PREG, ED - Abnormal; Notable for the following components:   Preg Test, Ur POSITIVE (*)    All other components within normal limits  ABO/RH     EKG     RADIOLOGY Please see ED Course for my review and interpretation.  I personally reviewed all radiographic images ordered to evaluate for the above acute complaints and reviewed radiology reports and findings.  These findings were personally discussed with the patient.  Please see medical record for radiology report.    PROCEDURES:  Critical Care performed: No  Procedures   MEDICATIONS ORDERED IN ED: Medications -  No data to display   IMPRESSION / MDM / ASSESSMENT AND PLAN / ED COURSE  I reviewed the triage vital signs and the nursing notes.                              Differential diagnosis includes, but is not limited to, ectopic, DU B, AUB, miscarriage  Patient presented to the ER for evaluation of symptoms as described above she is clinically well-appearing in no acute distress with benign exam.  Given her pregnancy state and concern for possible ectopic presentation ultrasound as well as blood work to be ordered for the above differential.  Patient's presentation is most consistent with acute presentation with potential threat to life or bodily function.   Clinical Course as of 01/10/22 1524  Tue Jan 10, 2022  1401 Patient's blood work is otherwise reassuring.  She is Rh+. [PR]  1450 Patient remains hemodynamically stable. [PR]  1451 My interpretation of ultrasound does show evidence  of IUP with reassuring fetal heart tones.  Will await formal radiology report. [PR]  1523 Was signed out to oncoming physician pending follow-up ultrasound.  Anticipate discharge home.   [PR]    Clinical Course User Index [PR] Willy Eddy, MD    FINAL CLINICAL IMPRESSION(S) / ED DIAGNOSES   Final diagnoses:  Vaginal bleeding in pregnancy     Rx / DC Orders   ED Discharge Orders     None        Note:  This document was prepared using Dragon voice recognition software and may include unintentional dictation errors.    Willy Eddy, MD 01/10/22 1524

## 2022-01-12 ENCOUNTER — Telehealth: Payer: Self-pay | Admitting: Family Medicine

## 2022-01-12 NOTE — Telephone Encounter (Signed)
RS pt and left message with new date and time for new OB appointment, Asked patient  to call if the new date / time does not work for her.

## 2022-01-13 ENCOUNTER — Encounter: Payer: Self-pay | Admitting: Emergency Medicine

## 2022-01-13 ENCOUNTER — Emergency Department
Admission: EM | Admit: 2022-01-13 | Discharge: 2022-01-13 | Disposition: A | Discharge status: Home / Self Care | Payer: Medicaid Other | Attending: Emergency Medicine | Admitting: Emergency Medicine

## 2022-01-13 ENCOUNTER — Other Ambulatory Visit: Payer: Self-pay

## 2022-01-13 ENCOUNTER — Emergency Department: Payer: Medicaid Other

## 2022-01-13 DIAGNOSIS — O209 Hemorrhage in early pregnancy, unspecified: Secondary | ICD-10-CM | POA: Diagnosis present

## 2022-01-13 DIAGNOSIS — O219 Vomiting of pregnancy, unspecified: Secondary | ICD-10-CM | POA: Diagnosis not present

## 2022-01-13 DIAGNOSIS — Z3491 Encounter for supervision of normal pregnancy, unspecified, first trimester: Secondary | ICD-10-CM

## 2022-01-13 DIAGNOSIS — R519 Headache, unspecified: Secondary | ICD-10-CM | POA: Diagnosis not present

## 2022-01-13 DIAGNOSIS — R824 Acetonuria: Secondary | ICD-10-CM | POA: Diagnosis not present

## 2022-01-13 DIAGNOSIS — R112 Nausea with vomiting, unspecified: Secondary | ICD-10-CM

## 2022-01-13 DIAGNOSIS — Z3A09 9 weeks gestation of pregnancy: Secondary | ICD-10-CM | POA: Diagnosis not present

## 2022-01-13 DIAGNOSIS — R8271 Bacteriuria: Secondary | ICD-10-CM | POA: Diagnosis not present

## 2022-01-13 DIAGNOSIS — R197 Diarrhea, unspecified: Secondary | ICD-10-CM | POA: Insufficient documentation

## 2022-01-13 LAB — COMPREHENSIVE METABOLIC PANEL
ALT: 31 U/L (ref 0–44)
AST: 29 U/L (ref 15–41)
Albumin: 3.8 g/dL (ref 3.5–5.0)
Alkaline Phosphatase: 59 U/L (ref 38–126)
Anion gap: 10 (ref 5–15)
BUN: 8 mg/dL (ref 6–20)
CO2: 21 mmol/L — ABNORMAL LOW (ref 22–32)
Calcium: 9.2 mg/dL (ref 8.9–10.3)
Chloride: 104 mmol/L (ref 98–111)
Creatinine, Ser: 0.6 mg/dL (ref 0.44–1.00)
GFR, Estimated: >60 mL/min (ref >60)
Glucose, Bld: 99 mg/dL (ref 70–99)
Potassium: 3.7 mmol/L (ref 3.5–5.1)
Sodium: 135 mmol/L (ref 135–145)
Total Bilirubin: 0.7 mg/dL (ref 0.3–1.2)
Total Protein: 8.1 g/dL (ref 6.5–8.1)

## 2022-01-13 LAB — CBC WITH DIFFERENTIAL/PLATELET
Abs Immature Granulocytes: 0.04 10*3/uL (ref 0.00–0.07)
Basophils Absolute: 0 10*3/uL (ref 0.0–0.1)
Basophils Relative: 0 %
Eosinophils Absolute: 0 10*3/uL (ref 0.0–0.5)
Eosinophils Relative: 0 %
HCT: 37.2 % (ref 36.0–46.0)
Hemoglobin: 12.3 g/dL (ref 12.0–15.0)
Immature Granulocytes: 0 %
Lymphocytes Relative: 9 %
Lymphs Abs: 1.3 10*3/uL (ref 0.7–4.0)
MCH: 26.7 pg (ref 26.0–34.0)
MCHC: 33.1 g/dL (ref 30.0–36.0)
MCV: 80.9 fL (ref 80.0–100.0)
Monocytes Absolute: 0.4 10*3/uL (ref 0.1–1.0)
Monocytes Relative: 3 %
Neutro Abs: 12.1 10*3/uL — ABNORMAL HIGH (ref 1.7–7.7)
Neutrophils Relative %: 88 %
Platelets: 381 10*3/uL (ref 150–400)
RBC: 4.6 MIL/uL (ref 3.87–5.11)
RDW: 16.6 % — ABNORMAL HIGH (ref 11.5–15.5)
WBC: 13.9 10*3/uL — ABNORMAL HIGH (ref 4.0–10.5)
nRBC: 0 % (ref 0.0–0.2)

## 2022-01-13 LAB — URINALYSIS, COMPLETE (UACMP) WITH MICROSCOPIC
Bilirubin Urine: NEGATIVE
Glucose, UA: NEGATIVE mg/dL
Hgb urine dipstick: NEGATIVE
Ketones, ur: 20 mg/dL — AB
Leukocytes,Ua: NEGATIVE
Nitrite: NEGATIVE
Protein, ur: NEGATIVE mg/dL
Specific Gravity, Urine: 1.031 — ABNORMAL HIGH (ref 1.005–1.030)
pH: 5 (ref 5.0–8.0)

## 2022-01-13 LAB — LIPASE, BLOOD: Lipase: 26 U/L (ref 11–51)

## 2022-01-13 MED ORDER — DOXYLAMINE SUCCINATE (SLEEP) 25 MG PO TABS
25.0000 mg | ORAL_TABLET | ORAL | Status: AC
  Administered 2022-01-13: 25 mg via ORAL
  Filled 2022-01-13: qty 1

## 2022-01-13 MED ORDER — PYRIDOXINE HCL 100 MG/ML IJ SOLN
100.0000 mg | Freq: Once | INTRAMUSCULAR | Status: AC
  Administered 2022-01-13: 100 mg via INTRAVENOUS
  Filled 2022-01-13: qty 1

## 2022-01-13 MED ORDER — CEPHALEXIN 500 MG PO CAPS: 500.0000 mg | ORAL_CAPSULE | Freq: Four times a day (QID) | ORAL | 0 refills | Status: AC

## 2022-01-13 MED ORDER — ACETAMINOPHEN 10 MG/ML IV SOLN
1000.0000 mg | INTRAVENOUS | Status: AC
  Administered 2022-01-13: 1000 mg via INTRAVENOUS
  Filled 2022-01-13: qty 100

## 2022-01-13 MED ORDER — LACTATED RINGERS IV BOLUS
1000.0000 mL | Freq: Once | INTRAVENOUS | Status: AC
  Administered 2022-01-13: 1000 mL via INTRAVENOUS

## 2022-01-13 MED ORDER — DOXYLAMINE-PYRIDOXINE 10-10 MG PO TBEC
1.0000 | DELAYED_RELEASE_TABLET | Freq: Two times a day (BID) | ORAL | 0 refills | Status: AC | PRN
Start: 2022-01-13 — End: 2022-02-12

## 2022-01-13 NOTE — ED Provider Notes (Signed)
I am  [facility] Provider Note    Event Date/Time   First MD Initiated Contact with Patient 01/13/22 1318     (approximate)   History   Emesis   HPI  Julie Guzman is a 21 y.o. female G4, approximately 9 weeks with past medical history of preeclampsia and recent ED evaluation on 6/13 for evaluation of vaginal bleeding diagnosis of chronic hemorrhage have not yet established prenatal care for this pregnancy who presents for evaluation of cute onset of generalized crampy abdominal pain associate with nonbloody nonbilious vomiting diarrhea and headache.  No cough, chest pain, shortness of breath, back pain, rash or extremity pain.  Patient states she has a slight amount of burning urination but no vaginal bleeding or discharge otherwise.  No recent trauma injuries or falls or significant NSAID use EtOH use or illicit drug use.  She has not yet had prenatal appointment for this pregnancy.  He has no other acute concerns at this time.  She does note several people in her household have nausea vomiting diarrhea-like illness as well.   Past Medical History:  Diagnosis Date   Pre-eclampsia affecting childbirth          Physical Exam  Triage Vital Signs: ED Triage Vitals  Enc Vitals Group     BP 01/13/22 1309 128/84     Pulse Rate 01/13/22 1309 76     Resp 01/13/22 1309 20     Temp 01/13/22 1309 98.1 F (36.7 C)     Temp Source 01/13/22 1309 Oral     SpO2 01/13/22 1309 100 %     Weight 01/13/22 1310 220 lb 0.3 oz (99.8 kg)     Height 01/13/22 1310 5' (1.524 m)     Head Circumference --      Peak Flow --      Pain Score 01/13/22 1310 10     Pain Loc --      Pain Edu? --      Excl. in GC? --     Most recent vital signs: Vitals:   01/13/22 1309 01/13/22 1724  BP: 128/84 127/75  Pulse: 76 77  Resp: 20 18  Temp: 98.1 F (36.7 C)   SpO2: 100% 100%    General: Awake is appear uncomfortable. CV:  : Capillary fill in the digits.  2+ radial pulses.   No significant murmur. Resp:  Normal effort.  Abd:  No distention.  Soft throughout. Other:  Dry mucous membranes.  No CVA tenderness.   ED Results / Procedures / Treatments  Labs (all labs ordered are listed, but only abnormal results are displayed) Labs Reviewed  CBC WITH DIFFERENTIAL/PLATELET - Abnormal; Notable for the following components:      Result Value   WBC 13.9 (*)    RDW 16.6 (*)    Neutro Abs 12.1 (*)    All other components within normal limits  COMPREHENSIVE METABOLIC PANEL - Abnormal; Notable for the following components:   CO2 21 (*)    All other components within normal limits  URINALYSIS, COMPLETE (UACMP) WITH MICROSCOPIC - Abnormal; Notable for the following components:   Color, Urine YELLOW (*)    APPearance HAZY (*)    Specific Gravity, Urine 1.031 (*)    Ketones, ur 20 (*)    Bacteria, UA RARE (*)    All other components within normal limits  URINE CULTURE  LIPASE, BLOOD     EKG    RADIOLOGY  OB ultrasound obtained  today shows IUP live at 9 weeks 4 days by size with a heart rate of 167 and a tiny posterior subchronic hematoma without any other abnormalities noted by radiology.  I am unable to visualize images myself at this time in the EHR.  PROCEDURES:  Critical Care performed: No  Procedures   MEDICATIONS ORDERED IN ED: Medications  lactated ringers bolus 1,000 mL (0 mLs Intravenous Stopped 01/13/22 1724)  acetaminophen (OFIRMEV) IV 1,000 mg (0 mg Intravenous Stopped 01/13/22 1556)  pyridOXINE (B-6) injection 100 mg (100 mg Intravenous Given 01/13/22 1521)  doxylamine (Sleep) (UNISOM) tablet 25 mg (25 mg Oral Given 01/13/22 1520)     IMPRESSION / MDM / ASSESSMENT AND PLAN / ED COURSE  I reviewed the triage vital signs and the nursing notes. Patient's presentation is most consistent with acute presentation with potential threat to life or bodily function.                               Differential diagnosis includes, but is not limited  to, acute infectious gastroenteritis possibly associate with kidney injury or metabolic derangements, peptic ulcer disease, cystitis with lower suspicion for kidney stone or pyelonephritis given absence of any back pain.  Pain is fairly diffuse  OB ultrasound obtained today shows IUP live at 9 weeks 4 days by size with a heart rate of 167 and a tiny posterior subchronic hematoma without any other abnormalities noted by radiology.  I am unable to visualize images myself at this time in the EHR.  CMP shows no significant electrolyte or metabolic derangements.  CBC shows WC count of 13.9 and hemoglobin of 12.3 with normal platelets.  Lipase is WNL and not suggestive of pancreatitis.  UA has some ketones and rare bacteria but no other convincing evidence of infection.  We will treat this as asymptomatic bacteriuria in pregnancy with a short course of Keflex.  My assessment patient is feeling much better.  She is able tolerate applesauce and ginger ale without any difficulty.  I suspect likely acute infectious gastroenteritis and a below suspicion for other immediate life-threatening process.  Discussed importance of close outpatient OB/GYN follow-up and returning to the emergency room for any new or worsening of symptoms.  Rx written for Diclegis.  Discharged in stable condition.  Strict return precautions advised and discussed.      FINAL CLINICAL IMPRESSION(S) / ED DIAGNOSES   Final diagnoses:  Nausea vomiting and diarrhea  First trimester pregnancy  Bacteriuria     Rx / DC Orders   ED Discharge Orders          Ordered    Doxylamine-Pyridoxine (DICLEGIS) 10-10 MG TBEC  2 times daily PRN        01/13/22 1700    cephALEXin (KEFLEX) 500 MG capsule  4 times daily        01/13/22 1751             Note:  This document was prepared using Dragon voice recognition software and may include unintentional dictation errors.   Gilles Chiquito, MD 01/13/22 1753

## 2022-01-13 NOTE — ED Notes (Addendum)
Patient given applesauce. Patient has tolerated ginger ale well.

## 2022-01-13 NOTE — ED Notes (Signed)
Patient vomited x1 and was incontinent of urine. Patient was changed into a gown and taken to ultrasound by ultrasound tech. Dr. Katrinka Blazing aware.

## 2022-01-13 NOTE — ED Notes (Signed)
Patient was able to tolerate the sips of water while taking meds .

## 2022-01-13 NOTE — ED Triage Notes (Addendum)
First Nurse Note;  Vomiting since last night and abd pain. Pt was here 2 day ago for vaginal bleeding and states she had a vaginal bleeding and a tear in her uterus. Pt has not received any prenatal care as of yet. Pt is [redacted] weeks pregnant. Pt is A&Ox4 and  NAD  G4 (with hx of 2 still born deliveries and 1  premature)  143/114 75 HR  100% on RA 102 CBG

## 2022-01-13 NOTE — ED Triage Notes (Signed)
Patient presents with sharp pelvic pain that started around midnight and had progressively gotten worse. Patient yelling out and crying in triage. Denies vaginal bleeding today. Patient is [redacted]w[redacted]d today. Patient is constantly N/V

## 2022-01-15 LAB — URINE CULTURE

## 2022-01-23 ENCOUNTER — Encounter: Payer: Self-pay | Admitting: Advanced Practice Midwife

## 2022-01-23 ENCOUNTER — Ambulatory Visit: Payer: Medicaid Other | Admitting: Advanced Practice Midwife

## 2022-01-23 VITALS — Ht 62.0 in | Wt 258.4 lb

## 2022-01-23 DIAGNOSIS — Z5321 Procedure and treatment not carried out due to patient leaving prior to being seen by health care provider: Secondary | ICD-10-CM

## 2022-01-23 NOTE — Progress Notes (Signed)
BMI 35. Patient does not wish to stay the duration of the appointment. Desires to reschedule.

## 2022-01-27 ENCOUNTER — Ambulatory Visit: Payer: Medicaid Other | Admitting: Physician Assistant

## 2022-01-27 ENCOUNTER — Encounter: Payer: Self-pay | Admitting: Physician Assistant

## 2022-01-27 VITALS — BP 124/84 | HR 78 | Temp 97.2°F | Wt 256.0 lb

## 2022-01-27 DIAGNOSIS — O99211 Obesity complicating pregnancy, first trimester: Secondary | ICD-10-CM

## 2022-01-27 DIAGNOSIS — O0991 Supervision of high risk pregnancy, unspecified, first trimester: Secondary | ICD-10-CM | POA: Diagnosis not present

## 2022-01-27 DIAGNOSIS — O418X10 Other specified disorders of amniotic fluid and membranes, first trimester, not applicable or unspecified: Secondary | ICD-10-CM

## 2022-01-27 DIAGNOSIS — O468X1 Other antepartum hemorrhage, first trimester: Secondary | ICD-10-CM

## 2022-01-27 DIAGNOSIS — Z3400 Encounter for supervision of normal first pregnancy, unspecified trimester: Secondary | ICD-10-CM

## 2022-01-27 DIAGNOSIS — O0990 Supervision of high risk pregnancy, unspecified, unspecified trimester: Secondary | ICD-10-CM

## 2022-01-27 DIAGNOSIS — Z8759 Personal history of other complications of pregnancy, childbirth and the puerperium: Secondary | ICD-10-CM | POA: Insufficient documentation

## 2022-01-27 DIAGNOSIS — O34219 Maternal care for unspecified type scar from previous cesarean delivery: Secondary | ICD-10-CM

## 2022-01-27 DIAGNOSIS — O99210 Obesity complicating pregnancy, unspecified trimester: Secondary | ICD-10-CM

## 2022-01-27 DIAGNOSIS — F1290 Cannabis use, unspecified, uncomplicated: Secondary | ICD-10-CM

## 2022-01-27 DIAGNOSIS — O99321 Drug use complicating pregnancy, first trimester: Secondary | ICD-10-CM

## 2022-01-27 DIAGNOSIS — O09292 Supervision of pregnancy with other poor reproductive or obstetric history, second trimester: Secondary | ICD-10-CM

## 2022-01-27 LAB — URINALYSIS
Bilirubin, UA: NEGATIVE
Glucose, UA: NEGATIVE
Ketones, UA: NEGATIVE
Leukocytes,UA: NEGATIVE
Nitrite, UA: NEGATIVE
Protein,UA: NEGATIVE
RBC, UA: NEGATIVE
Specific Gravity, UA: 1.03 (ref 1.005–1.030)
Urobilinogen, Ur: 0.2 mg/dL (ref 0.2–1.0)
pH, UA: 6 (ref 5.0–7.5)

## 2022-01-27 LAB — WET PREP FOR TRICH, YEAST, CLUE
Trichomonas Exam: NEGATIVE
Yeast Exam: NEGATIVE

## 2022-01-27 MED ORDER — ASPIRIN 81 MG PO TBEC: 81.0000 mg | DELAYED_RELEASE_TABLET | Freq: Every day | ORAL | Status: DC

## 2022-01-27 NOTE — Progress Notes (Signed)
Does not wish to be told sex of babyInitial prenatal visit: 0915 started  drinking glucola, completed drinking at 0919. Instructive given to not eat, drink or chew gum until blood drawn.   On 01/13/2022 Comprehensive Metabolic panel and hgb completed, provider aware. Provider reviewed [facility] Lab results today during visit prior to discharge. Patient instructed to return for Follow Up at [facility] in 2 weeks. Recommended that patient should take vitamins with folic acid; patient does not wish to take PNVs. Delynn Flavin RN

## 2022-01-27 NOTE — Progress Notes (Signed)
[facility] Health Department  Maternal Health Clinic   INITIAL PRENATAL VISIT NOTE  Subjective:  Julie Guzman is a 21 y.o. [phone] at [redacted]w[redacted]d being seen today to start prenatal care at the [facility] Department.  She is currently monitored for the following issues for this high-risk pregnancy and has Abnormal maternal glucose tolerance, antepartum; Maternal varicella, non-immune; Obesity in pregnancy, antepartum; Supervision of high-risk pregnancy, unspecified trimester; History of stillbirth in pregnant patient in second trimester, antepartum; History of cesarean delivery, antepartum; Marijuana use during pregnancy; and Subchorionic hematoma in first trimester on their problem list.  Patient reports nausea.  Contractions: Not present. Vag. Bleeding: None.  Movement: Absent. Denies leaking of fluid.   Indications for ASA therapy (per uptodate) One of the following: Previous pregnancy with preeclampsia, especially early onset and with an adverse outcome Yes Multifetal gestation No Chronic hypertension No Type 1 or 2 diabetes mellitus No Chronic kidney disease No Autoimmune disease (antiphospholipid syndrome, systemic lupus erythematosus) No  Two or more of the following: Nulliparity No Obesity (body mass index >30 kg/m2) Yes Family history of preeclampsia in mother or sister No Age ?35 years No Sociodemographic characteristics (African American race, low socioeconomic level) Yes Personal risk factors (eg, previous pregnancy with low birth weight or small for gestational age infant, previous adverse pregnancy outcome [eg, stillbirth], interval >10 years between pregnancies) Yes   The following portions of the patient's history were reviewed and updated as appropriate: allergies, current medications, past family history, past medical history, past social history, past surgical history and problem list. Problem list updated.  Objective:   Vitals:   01/27/22 0824  BP: 124/84   Pulse: 78  Temp: (!) 97.2 F (36.2 C)  Weight: 256 lb (116.1 kg)    Fetal Status: Fetal Heart Rate (bpm): 152 Fundal Height: 12 cm Movement: Absent  Presentation: Undeterminable  Physical Exam Vitals and nursing note reviewed.  Constitutional:      General: She is not in acute distress.    Appearance: Normal appearance. She is well-developed. She is obese.  HENT:     Head: Normocephalic and atraumatic.     Right Ear: External ear normal.     Left Ear: External ear normal.     Nose: Nose normal. No congestion or rhinorrhea.     Mouth/Throat:     Lips: Pink.     Mouth: Mucous membranes are moist.     Dentition: Normal dentition. No dental caries.     Pharynx: Oropharynx is clear. Uvula midline.     Comments: Dentition: good dentition Eyes:     General: No scleral icterus.    Conjunctiva/sclera: Conjunctivae normal.  Neck:     Thyroid: No thyroid mass or thyromegaly.  Cardiovascular:     Rate and Rhythm: Normal rate and regular rhythm.     Pulses: Normal pulses.     Heart sounds: Normal heart sounds.     Comments: Extremities are warm and well perfused Pulmonary:     Effort: Pulmonary effort is normal.     Breath sounds: Normal breath sounds.  Chest:     Chest wall: No mass.  Breasts:    Tanner Score is 5.     Breasts are symmetrical.     Right: Normal. No mass, nipple discharge or skin change.     Left: Normal. No mass, nipple discharge or skin change.  Abdominal:     General: Abdomen is flat.     Palpations: Abdomen is soft.  Tenderness: There is no abdominal tenderness.     Comments: Horiz well-healed suprapubic scar  Genitourinary:    General: Normal vulva.     Exam position: Lithotomy position.     Pubic Area: No rash.      Labia:        Right: No rash.        Left: No rash.      Vagina: Normal. No vaginal discharge.     Cervix: No cervical motion tenderness or friability.     Uterus: Normal. Enlarged (Gravid 12 week size). Not tender.      Adnexa:  Right adnexa normal and left adnexa normal.     Rectum: Normal. No external hemorrhoid.  Musculoskeletal:     Right lower leg: No edema.     Left lower leg: No edema.  Lymphadenopathy:     Cervical: No cervical adenopathy.     Upper Body:     Right upper body: No axillary adenopathy.     Left upper body: No axillary adenopathy.  Skin:    General: Skin is warm.     Capillary Refill: Capillary refill takes less than 2 seconds.     Comments: Tattoes noted  Neurological:     Mental Status: She is alert and oriented to person, place, and time.     Assessment and Plan:  Pregnancy: [id number] at [redacted]w[redacted]d with close interconceptional spacing. Feels well except some nausea. Occ uses marijuana. Occ uses nicotine. Encouraged to stop both.   1. Supervision of high-risk pregnancy, unspecified trimester Refer to MFM for consultation re: prenatal management, patient may benefit from transfer of care to high risk ob vs co-management in light of numerous high risk factors. Patient is in agreement with this plan. - Varicella zoster antibody, IgG - HIV-1/HIV-2 Qualitative RNA - HCV Ab w Reflex to Quant PCR - Urine Culture - Chlamydia/GC NAA, Confirmation - Glucose, 1 hour gestational - Hgb A1c w/o eAG - Protein / creatinine ratio, urine  (Spot) - TSH - Prenatal profile without Varicella/Rubella ([id number]) - WET PREP FOR TRICH, YEAST, CLUE - Urinalysis (Urine Dip) - MaterniT21 PLUS Core - Pap IG (Image Guided)  2. Obesity in pregnancy, antepartum Counseled re: optimal weight gain. Baseline labs today, refer to MNT, start low-dose aspirin at 12 weeks. - Amb ref to Medical Nutrition Therapy-MNT - aspirin EC 81 MG tablet; Take 1 tablet (81 mg total) by mouth daily. Swallow whole.  3. History of stillbirth in pregnant patient in second trimester, antepartum H/o stillbirth at 24 weeks x2. HR OB referral, start aspirin at 12 weeks. - aspirin EC 81 MG tablet; Take 1 tablet (81 mg total) by mouth daily.  Swallow whole.  4. History of cesarean delivery, antepartum Pt desires repeat C/S.  5. Marijuana use during pregnancy Counseled cessation. Agrees to UDS. Give "Plan of Safe Care" at RV. - [id number] 7+Oxycodone-Bund  6. Subchorionic hematoma in first trimester, single or unspecified fetus Monitor for bleeding.   Discussed overview of care and coordination with inpatient delivery practices including [facility], [facility], [facility] and [facility].    Preterm labor symptoms and general obstetric precautions including but not limited to vaginal bleeding, contractions, leaking of fluid and fetal movement were reviewed in detail with the patient.  Please refer to After Visit Summary for other counseling recommendations.   Return in about 2 weeks (around 02/10/2022) for Routine prenatal care.  Future Appointments  Date Time Provider Department Center  02/10/2022  3:20 PM [facility] PROVIDER [facility] None  Landry Dyke, PA-C

## 2022-01-28 LAB — PROTEIN / CREATININE RATIO, URINE
Creatinine, Urine: 220.9 mg/dL
Protein, Ur: 14.6 mg/dL
Protein/Creat Ratio: 66 mg/g creat (ref 0–200)

## 2022-01-29 LAB — URINE CULTURE

## 2022-01-30 ENCOUNTER — Other Ambulatory Visit: Payer: Self-pay | Admitting: Physician Assistant

## 2022-01-30 ENCOUNTER — Telehealth: Payer: Self-pay

## 2022-01-30 DIAGNOSIS — O09899 Supervision of other high risk pregnancies, unspecified trimester: Secondary | ICD-10-CM | POA: Insufficient documentation

## 2022-01-30 DIAGNOSIS — Z3689 Encounter for other specified antenatal screening: Secondary | ICD-10-CM

## 2022-01-30 DIAGNOSIS — O99810 Abnormal glucose complicating pregnancy: Secondary | ICD-10-CM | POA: Insufficient documentation

## 2022-01-30 LAB — CBC/D/PLT+RPR+RH+ABO+AB SCR
Antibody Screen: NEGATIVE
Basophils Absolute: 0 10*3/uL (ref 0.0–0.2)
Basos: 0 %
EOS (ABSOLUTE): 0.1 10*3/uL (ref 0.0–0.4)
Eos: 1 %
Hematocrit: 34.9 % (ref 34.0–46.6)
Hemoglobin: 11.6 g/dL (ref 11.1–15.9)
Hepatitis B Surface Ag: NEGATIVE
Immature Grans (Abs): 0 10*3/uL (ref 0.0–0.1)
Immature Granulocytes: 0 %
Lymphocytes Absolute: 2.4 10*3/uL (ref 0.7–3.1)
Lymphs: 31 %
MCH: 27.6 pg (ref 26.6–33.0)
MCHC: 33.2 g/dL (ref 31.5–35.7)
MCV: 83 fL (ref 79–97)
Monocytes Absolute: 0.3 10*3/uL (ref 0.1–0.9)
Monocytes: 5 %
Neutrophils Absolute: 4.8 10*3/uL (ref 1.4–7.0)
Neutrophils: 63 %
Platelets: 351 10*3/uL (ref 150–450)
RBC: 4.21 x10E6/uL (ref 3.77–5.28)
RDW: 16.3 % — ABNORMAL HIGH (ref 11.7–15.4)
RPR Ser Ql: NONREACTIVE
Rh Factor: POSITIVE
WBC: 7.6 10*3/uL (ref 3.4–10.8)

## 2022-01-30 LAB — HCV INTERPRETATION

## 2022-01-30 LAB — CHLAMYDIA/GC NAA, CONFIRMATION
Chlamydia trachomatis, NAA: NEGATIVE
Neisseria gonorrhoeae, NAA: NEGATIVE

## 2022-01-30 LAB — HCV AB W REFLEX TO QUANT PCR: HCV Ab: NONREACTIVE

## 2022-01-30 LAB — GLUCOSE, 1 HOUR GESTATIONAL: Gestational Diabetes Screen: 147 mg/dL — ABNORMAL HIGH (ref 70–139)

## 2022-01-30 LAB — HIV-1/HIV-2 QUALITATIVE RNA
HIV-1 RNA, Qualitative: NONREACTIVE
HIV-2 RNA, Qualitative: NONREACTIVE

## 2022-01-30 LAB — HGB A1C W/O EAG: Hgb A1c MFr Bld: 5.6 % (ref 4.8–5.6)

## 2022-01-30 LAB — VARICELLA ZOSTER ANTIBODY, IGG: Varicella zoster IgG: <135 index — ABNORMAL LOW (ref >165)

## 2022-01-30 LAB — TSH: TSH: 1.52 u[IU]/mL (ref 0.450–4.500)

## 2022-01-30 NOTE — Telephone Encounter (Signed)
Call to client as needs 3 hour GTT. Left message requesting she call [facility] regarding glucose test result on 01/27/2022 and need for additional diabetes testing. Number to call provided. Jossie Ng, RN

## 2022-01-31 LAB — 789231 7+OXYCODONE-BUND
Amphetamines, Urine: NEGATIVE ng/mL
BENZODIAZ UR QL: NEGATIVE ng/mL
Barbiturate screen, urine: NEGATIVE ng/mL
Cocaine (Metab.): NEGATIVE ng/mL
OPIATE SCREEN URINE: NEGATIVE ng/mL
Oxycodone/Oxymorphone, Urine: NEGATIVE ng/mL
PCP Quant, Ur: NEGATIVE ng/mL

## 2022-01-31 LAB — CANNABINOID CONFIRMATION, UR
CANNABINOIDS: POSITIVE — AB
Carboxy THC GC/MS Conf: 373 ng/mL

## 2022-02-01 ENCOUNTER — Encounter: Payer: Self-pay | Admitting: Advanced Practice Midwife

## 2022-02-01 DIAGNOSIS — R825 Elevated urine levels of drugs, medicaments and biological substances: Secondary | ICD-10-CM | POA: Insufficient documentation

## 2022-02-01 NOTE — Telephone Encounter (Signed)
Left message for patient to call [facility] to schedule 3 HR GTT test. Also notified of MFM Clinic U/S and consult for 03/23/2022. Call back patient to give instructions. Delynn Flavin RN

## 2022-02-01 NOTE — Telephone Encounter (Signed)
Closing this duplicate phone note regarding the need for a 3 HR GTT due to Delynn Flavin starting a new phone note on 02-01-22 regarding the need for a 3 HR GTT and also information about patient U/S instructions.  Hart Carwin, RN

## 2022-02-03 LAB — MATERNIT 21 PLUS CORE, BLOOD
Fetal Fraction: 4
Result (T21): NEGATIVE
Trisomy 13 (Patau syndrome): NEGATIVE
Trisomy 18 (Edwards syndrome): NEGATIVE
Trisomy 21 (Down syndrome): NEGATIVE

## 2022-02-03 LAB — PAP IG (IMAGE GUIDED): PAP Smear Comment: 0

## 2022-02-03 NOTE — Telephone Encounter (Signed)
Telephone call to patient regarding the need for a 3 HR GTT due to her abnormal lab.  She is scheduled for arrival time 8 am on 02-06-22.  She has been told NPO after midnight Sunday night.  She was also told about her [facility] MFM U/S on 03-23-22 at 1 pm (arrival time 12:45).  Patient verbalizes understanding.  Hart Carwin, RN

## 2022-02-06 ENCOUNTER — Ambulatory Visit: Payer: Medicaid Other | Admitting: Advanced Practice Midwife

## 2022-02-06 VITALS — BP 123/85 | HR 79 | Wt 253.4 lb

## 2022-02-06 DIAGNOSIS — O0990 Supervision of high risk pregnancy, unspecified, unspecified trimester: Secondary | ICD-10-CM

## 2022-02-06 DIAGNOSIS — O0991 Supervision of high risk pregnancy, unspecified, first trimester: Secondary | ICD-10-CM

## 2022-02-06 DIAGNOSIS — O99810 Abnormal glucose complicating pregnancy: Secondary | ICD-10-CM

## 2022-02-06 NOTE — Progress Notes (Signed)
Patient here for 3 hour gtt. Patient vomited after drinking 1/4/ of the drink per Shawn in the lab. Peds list given. Counseled Varicella non-immune. Literature given, patient aware needs varivax PP. Patient aware of 03/23/22 U/S and consult. Hgb fractionation done today. Patient to be rescheduled for 3 hour gtt. Patient requested sealed envelope with gender of baby inside --given.Burt Knack, RN

## 2022-02-06 NOTE — Progress Notes (Signed)
20 yo BF G4P1 at 13.0 wks here for 3 hour glucola. Vomited 1/2 through drinking glucola. Counseled pt and gave rx for Phenergan 25 mg #2--take one tonight at bedtime and then another 30 minutes before comes in for glucola tomorrow am. Pt states had preeclampsia with prior pregnancy and IUFD x2 and says her prenatal vits "killed my babies". Counseled pt about prenatal vits and need for to prevent OSB, cleft lip/palate. Counseled pt to stop smoking MJ and the need to take ASA 81 mg daily to prevent preeclampsia. Pt states she wasn't told to take ASA; pt counseled to begin taking today. Apt made for tomorrow am for 3 hour GTT

## 2022-02-07 ENCOUNTER — Ambulatory Visit: Payer: Medicaid Other | Admitting: Advanced Practice Midwife

## 2022-02-07 DIAGNOSIS — O99810 Abnormal glucose complicating pregnancy: Secondary | ICD-10-CM

## 2022-02-07 LAB — GLUCOSE, FASTING: Glucose, Plasma: 86 mg/dL (ref 70–99)

## 2022-02-07 NOTE — Progress Notes (Signed)
Presents for 3 hour GTT. States took Phenergan ~ 0730 this am. Reports NPO since 9 pm last night except for water. Aware of 02/10/22 [facility] RV appt. To return to clinic for questions / problems. Jossie Ng, RN

## 2022-02-08 LAB — GLUCOSE TOLERANCE TEST, 6 HOUR
Glucose, 1 Hour GTT: 107 mg/dL (ref 70–199)
Glucose, 2 hour: 94 mg/dL (ref 70–139)
Glucose, 3 hour: 78 mg/dL (ref 70–109)
Glucose, GTT - Fasting: 82 mg/dL (ref 70–99)

## 2022-02-08 LAB — HGB FRACTIONATION CASCADE
Hgb A2: 2.8 % (ref 1.8–3.2)
Hgb A: 97.2 % (ref 96.4–98.8)
Hgb F: 0 % (ref 0.0–2.0)
Hgb S: 0 %

## 2022-02-09 NOTE — Addendum Note (Signed)
Addended by: Heywood Bene on: 02/09/2022 12:43 PM   Modules accepted: Orders

## 2022-02-10 ENCOUNTER — Ambulatory Visit: Payer: Medicaid Other

## 2022-02-10 ENCOUNTER — Telehealth: Payer: Self-pay | Admitting: Family Medicine

## 2022-02-13 ENCOUNTER — Telehealth: Payer: Self-pay | Admitting: Family Medicine

## 2022-02-13 NOTE — Telephone Encounter (Signed)
LMTC to rs  MH / FU appointment

## 2022-02-13 NOTE — Telephone Encounter (Signed)
Note added to PAP result card for patient to please call [phone] to reschedule her appointment. PAP card mailed today.   Hart Carwin, RN

## 2022-02-14 NOTE — Telephone Encounter (Signed)
Patient called into clerical to reschedule her [facility] RV.  New appointment is 02-27-22 at 1:20 pm.  Hart Carwin, RN

## 2022-02-21 ENCOUNTER — Emergency Department
Admission: EM | Admit: 2022-02-21 | Discharge: 2022-02-21 | Discharge status: Against Medical Advice | Payer: Medicaid Other

## 2022-02-21 NOTE — ED Notes (Signed)
Called for triage with no response from lobby.

## 2022-02-25 ENCOUNTER — Emergency Department
Admission: EM | Admit: 2022-02-25 | Discharge: 2022-02-25 | Disposition: A | Discharge status: Home / Self Care | Payer: Medicaid Other | Attending: Emergency Medicine | Admitting: Emergency Medicine

## 2022-02-25 ENCOUNTER — Emergency Department: Payer: Medicaid Other

## 2022-02-25 ENCOUNTER — Other Ambulatory Visit: Payer: Self-pay

## 2022-02-25 DIAGNOSIS — Z3A15 15 weeks gestation of pregnancy: Secondary | ICD-10-CM | POA: Diagnosis not present

## 2022-02-25 DIAGNOSIS — O418X20 Other specified disorders of amniotic fluid and membranes, second trimester, not applicable or unspecified: Secondary | ICD-10-CM | POA: Insufficient documentation

## 2022-02-25 DIAGNOSIS — R109 Unspecified abdominal pain: Secondary | ICD-10-CM | POA: Insufficient documentation

## 2022-02-25 DIAGNOSIS — Z3492 Encounter for supervision of normal pregnancy, unspecified, second trimester: Secondary | ICD-10-CM

## 2022-02-25 DIAGNOSIS — O468X2 Other antepartum hemorrhage, second trimester: Secondary | ICD-10-CM

## 2022-02-25 LAB — URINALYSIS, COMPLETE (UACMP) WITH MICROSCOPIC
Bilirubin Urine: NEGATIVE
Glucose, UA: NEGATIVE mg/dL
Hgb urine dipstick: NEGATIVE
Ketones, ur: NEGATIVE mg/dL
Nitrite: NEGATIVE
Protein, ur: 30 mg/dL — AB
Specific Gravity, Urine: 1.029 (ref 1.005–1.030)
pH: 5 (ref 5.0–8.0)

## 2022-02-25 LAB — COMPREHENSIVE METABOLIC PANEL
ALT: 22 U/L (ref 0–44)
AST: 23 U/L (ref 15–41)
Albumin: 3.4 g/dL — ABNORMAL LOW (ref 3.5–5.0)
Alkaline Phosphatase: 63 U/L (ref 38–126)
Anion gap: 9 (ref 5–15)
BUN: 6 mg/dL (ref 6–20)
CO2: 21 mmol/L — ABNORMAL LOW (ref 22–32)
Calcium: 8.9 mg/dL (ref 8.9–10.3)
Chloride: 105 mmol/L (ref 98–111)
Creatinine, Ser: 0.6 mg/dL (ref 0.44–1.00)
GFR, Estimated: >60 mL/min (ref >60)
Glucose, Bld: 105 mg/dL — ABNORMAL HIGH (ref 70–99)
Potassium: 3.5 mmol/L (ref 3.5–5.1)
Sodium: 135 mmol/L (ref 135–145)
Total Bilirubin: 0.4 mg/dL (ref 0.3–1.2)
Total Protein: 7.8 g/dL (ref 6.5–8.1)

## 2022-02-25 LAB — CBC WITH DIFFERENTIAL/PLATELET
Abs Immature Granulocytes: 0.02 10*3/uL (ref 0.00–0.07)
Basophils Absolute: 0 10*3/uL (ref 0.0–0.1)
Basophils Relative: 0 %
Eosinophils Absolute: 0.1 10*3/uL (ref 0.0–0.5)
Eosinophils Relative: 1 %
HCT: 36 % (ref 36.0–46.0)
Hemoglobin: 12 g/dL (ref 12.0–15.0)
Immature Granulocytes: 0 %
Lymphocytes Relative: 29 %
Lymphs Abs: 2.9 10*3/uL (ref 0.7–4.0)
MCH: 27.4 pg (ref 26.0–34.0)
MCHC: 33.3 g/dL (ref 30.0–36.0)
MCV: 82.2 fL (ref 80.0–100.0)
Monocytes Absolute: 0.4 10*3/uL (ref 0.1–1.0)
Monocytes Relative: 4 %
Neutro Abs: 6.5 10*3/uL (ref 1.7–7.7)
Neutrophils Relative %: 66 %
Platelets: 275 10*3/uL (ref 150–400)
RBC: 4.38 MIL/uL (ref 3.87–5.11)
RDW: 15 % (ref 11.5–15.5)
WBC: 10 10*3/uL (ref 4.0–10.5)
nRBC: 0 % (ref 0.0–0.2)

## 2022-02-25 LAB — LIPASE, BLOOD: Lipase: 24 U/L (ref 11–51)

## 2022-02-25 MED ORDER — ACETAMINOPHEN 500 MG PO TABS
1000.0000 mg | ORAL_TABLET | Freq: Once | ORAL | Status: AC
  Administered 2022-02-25: 1000 mg via ORAL
  Filled 2022-02-25: qty 2

## 2022-02-25 MED ORDER — VITAMIN B-6 50 MG PO TABS: 50.0000 mg | ORAL_TABLET | Freq: Every day | ORAL | 0 refills | Status: AC

## 2022-02-25 MED ORDER — CYCLOBENZAPRINE HCL 10 MG PO TABS: 10.0000 mg | ORAL_TABLET | Freq: Three times a day (TID) | ORAL | 0 refills | Status: AC | PRN

## 2022-02-25 MED ORDER — CYCLOBENZAPRINE HCL 10 MG PO TABS
10.0000 mg | ORAL_TABLET | Freq: Once | ORAL | Status: AC
Start: 2022-02-25 — End: 2022-02-25
  Administered 2022-02-25: 10 mg via ORAL
  Filled 2022-02-25: qty 1

## 2022-02-25 MED ORDER — LACTATED RINGERS IV BOLUS
1000.0000 mL | Freq: Once | INTRAVENOUS | Status: AC
  Administered 2022-02-25: 1000 mL via INTRAVENOUS

## 2022-02-25 NOTE — ED Provider Notes (Signed)
[facility] Provider Note    Event Date/Time   First MD Initiated Contact with Patient 02/25/22 [phone]     (approximate)   History   Abdominal Pain   HPI  Julie Guzman is a 21 y.o. female  G4P1 at approximately [redacted] weeks gestation followed by health department for high risk pregnancy secondary to abnormal glucose tolerance testing, nonimmunity to varicella, obesity, history of a previous second trimester stillbirth, and previous preeclampsia who presents for evaluation of some acute crampy lower abdominal pain starting last night.  She states she is not taking as a that had been recommended by her OB because she can afford it.  She states her 23-year-old often will jump on her stomach but nothing different out of the ordinary yesterday.  She can recall any injuries or falls.  She denies any chest pain, cough, shortness of breath, headache, earache, sore throat, diarrhea, burning with urination, vaginal bleeding or discharge or any other sick symptoms.  She took some Tylenol yesterday but none today.  No EtOH use illicit drug use or tobacco abuse.      Physical Exam  Triage Vital Signs: ED Triage Vitals [02/25/22 0935]  Enc Vitals Group     BP (!) 145/105     Pulse Rate 89     Resp 18     Temp 98.3 F (36.8 C)     Temp Source Oral     SpO2 99 %     Weight 250 lb (113.4 kg)     Height 5\' 2"  (1.575 m)     Head Circumference      Peak Flow      Pain Score 7     Pain Loc      Pain Edu?      Excl. in GC?     Most recent vital signs: Vitals:   02/25/22 0935  BP: (!) 145/105  Pulse: 89  Resp: 18  Temp: 98.3 F (36.8 C)  SpO2: 99%    General: Awake, no distress.  CV:  Good peripheral perfusion.  2+ radial pulses. Resp:  Normal effort.  Clear bilaterally. Abd:  No distention.  Mild suprapubic tenderness without any rigidity or guarding.  No CVA tenderness. Other:  Patient is obese.   ED Results / Procedures / Treatments  Labs (all labs ordered  are listed, but only abnormal results are displayed) Labs Reviewed  COMPREHENSIVE METABOLIC PANEL - Abnormal; Notable for the following components:      Result Value   CO2 21 (*)    Glucose, Bld 105 (*)    Albumin 3.4 (*)    All other components within normal limits  URINALYSIS, COMPLETE (UACMP) WITH MICROSCOPIC - Abnormal; Notable for the following components:   Color, Urine YELLOW (*)    APPearance CLOUDY (*)    Protein, ur 30 (*)    Leukocytes,Ua SMALL (*)    Bacteria, UA RARE (*)    All other components within normal limits  URINE CULTURE  CBC WITH DIFFERENTIAL/PLATELET  LIPASE, BLOOD     EKG    RADIOLOGY  OB ultrasound my interpretation shows intrauterine pregnancy in breech position.  Cervix appears closed.  I reviewed radiology interpretation and agree to findings of single IUP at 16 weeks 2 days by size with loss of cardiac hemorrhage.  This was previously noted on ultrasound from 6/16.  No other new hemorrhage.  No other acute process.  PROCEDURES:  Critical Care performed: No  Procedures  MEDICATIONS ORDERED IN ED: Medications  cyclobenzaprine (FLEXERIL) tablet 10 mg (has no administration in time range)  acetaminophen (TYLENOL) tablet 1,000 mg (1,000 mg Oral Given 02/25/22 0956)  lactated ringers bolus 1,000 mL (1,000 mLs Intravenous New Bag/Given 02/25/22 0957)     IMPRESSION / MDM / ASSESSMENT AND PLAN / ED COURSE  I reviewed the triage vital signs and the nursing notes. Patient's presentation is most consistent with acute presentation with potential threat to life or bodily function.                               Differential diagnosis includes, but is not limited to, ligament pain, cramps or dehydration, cystitis, subchorionic hemorrhage.  She is not peritoneal guarding and has no bleeding of the low suspicion for abruption, kidney stone or sepsis.  CMP shows no significant lecture metabolic derangements.  No evidence of acute hepatitis or  cholestatic process.   Lipase WNL nonsuggestive of pancreatitis.  CBC without leukocytosis or acute anemia.  UA has some protein, small extensors and rare bacteria but is contaminated squamous epithelial cells.  Patient has no urinary symptoms to suggest clinically significant cystitis at this time.  OB ultrasound my interpretation shows intrauterine pregnancy in breech position.  Cervix appears closed.  I reviewed radiology interpretation and agree to findings of single IUP at 16 weeks 2 days by size with loss of cardiac hemorrhage.  This was previously noted on ultrasound from 6/16.  No other new hemorrhage.  No other acute process.  I suspect possible cramping versus round ligament pain.  Her subchronic hemorrhage is unchanged today I did emphasize importance of having no trauma to her abdomen and avoiding her toddler from laying on her abdomen.  Discussed importance of close outpatient OB follow-up to have her blood pressure rechecked and follow-up her urine culture.  She is agreeable to plan.  Will write for short course of Flexeril and advised taking Tylenol as needed every 6 hours.  Discharged stable condition.  Strict and precautions advised and discussed      FINAL CLINICAL IMPRESSION(S) / ED DIAGNOSES   Final diagnoses:  Second trimester pregnancy  Abdominal pain, unspecified abdominal location  Subchorionic hemorrhage of placenta in second trimester, single or unspecified fetus     Rx / DC Orders   ED Discharge Orders          Ordered    cyclobenzaprine (FLEXERIL) 10 MG tablet  3 times daily PRN        02/25/22 1133    pyridOXINE (VITAMIN B6) 50 MG tablet  Daily        02/25/22 1133             Note:  This document was prepared using Dragon voice recognition software and may include unintentional dictation errors.   Gilles Chiquito, MD 02/25/22 1135

## 2022-02-25 NOTE — Discharge Instructions (Addendum)
Your ultrasound today showed: IMPRESSION: 1. Single live pregnancy with appropriate growth and estimated gestational age of [redacted] weeks 2 days. 2. Small subchorionic hemorrhage unlikely to be of consequence. No other complicating features.

## 2022-02-25 NOTE — ED Triage Notes (Signed)
Ambulatory to triage with c/o abd/pelvic pain. Cramping in nature. Onset apx 9 pm last night. Denies vaginal bleeding/discharge.  Currently [redacted]wks pregnant G4P1

## 2022-02-26 LAB — URINE CULTURE

## 2022-02-27 ENCOUNTER — Ambulatory Visit: Payer: Medicaid Other | Admitting: Advanced Practice Midwife

## 2022-02-27 VITALS — BP 98/67 | HR 84 | Temp 97.9°F | Wt 256.2 lb

## 2022-02-27 DIAGNOSIS — T7411XS Adult physical abuse, confirmed, sequela: Secondary | ICD-10-CM

## 2022-02-27 DIAGNOSIS — O99210 Obesity complicating pregnancy, unspecified trimester: Secondary | ICD-10-CM

## 2022-02-27 DIAGNOSIS — O0992 Supervision of high risk pregnancy, unspecified, second trimester: Secondary | ICD-10-CM

## 2022-02-27 DIAGNOSIS — O09299 Supervision of pregnancy with other poor reproductive or obstetric history, unspecified trimester: Secondary | ICD-10-CM | POA: Insufficient documentation

## 2022-02-27 DIAGNOSIS — O99212 Obesity complicating pregnancy, second trimester: Secondary | ICD-10-CM

## 2022-02-27 DIAGNOSIS — O09292 Supervision of pregnancy with other poor reproductive or obstetric history, second trimester: Secondary | ICD-10-CM

## 2022-02-27 DIAGNOSIS — O99810 Abnormal glucose complicating pregnancy: Secondary | ICD-10-CM

## 2022-02-27 DIAGNOSIS — O0993 Supervision of high risk pregnancy, unspecified, third trimester: Secondary | ICD-10-CM | POA: Diagnosis not present

## 2022-02-27 DIAGNOSIS — R825 Elevated urine levels of drugs, medicaments and biological substances: Secondary | ICD-10-CM

## 2022-02-27 DIAGNOSIS — O0990 Supervision of high risk pregnancy, unspecified, unspecified trimester: Secondary | ICD-10-CM

## 2022-02-27 DIAGNOSIS — O0932 Supervision of pregnancy with insufficient antenatal care, second trimester: Secondary | ICD-10-CM

## 2022-02-27 DIAGNOSIS — T7411XA Adult physical abuse, confirmed, initial encounter: Secondary | ICD-10-CM | POA: Insufficient documentation

## 2022-02-27 DIAGNOSIS — O0930 Supervision of pregnancy with insufficient antenatal care, unspecified trimester: Secondary | ICD-10-CM

## 2022-02-27 DIAGNOSIS — Z86711 Personal history of pulmonary embolism: Secondary | ICD-10-CM

## 2022-02-27 DIAGNOSIS — O34219 Maternal care for unspecified type scar from previous cesarean delivery: Secondary | ICD-10-CM

## 2022-02-27 LAB — URINALYSIS
Bilirubin, UA: NEGATIVE
Glucose, UA: NEGATIVE
Leukocytes,UA: NEGATIVE
Nitrite, UA: NEGATIVE
Protein,UA: NEGATIVE
RBC, UA: NEGATIVE
Specific Gravity, UA: 1.03 (ref 1.005–1.030)
Urobilinogen, Ur: 0.2 mg/dL (ref 0.2–1.0)
pH, UA: 6 (ref 5.0–7.5)
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
## 2022-02-27 NOTE — Progress Notes (Signed)
Recent ED visit 02/25/22 d/t stomach and back pain. Declines PNV. Declines AFP testing today. Undecided Pediatrician (has list). Reviewed urine dip prior to discharge. Delynn Flavin RN

## 2022-03-02 ENCOUNTER — Telehealth: Payer: Self-pay

## 2022-03-02 NOTE — Telephone Encounter (Signed)
Call to client to provide number to [facility] as per [facility], they have not been able to contact client to give [place] appt and transfer of care appt. Left message to call regarding above. Call to emergency contact (mother) to request assistance contacting client. Mother did a 3 way call and RN able to talk with client. Per client, she called [facility] this am and able to provide RN [place] appt on 03/07/22 with new OB appt on 03/16/2022. Follow-up end of 02/2022 [facility] RV appt cancelled and left message to cancel 03/23/2022 [facility] MFM [place] / consult appt. Jossie Ng, RN

## 2022-03-03 LAB — 789231 7+OXYCODONE-BUND
Amphetamines, Urine: NEGATIVE ng/mL
BENZODIAZ UR QL: NEGATIVE ng/mL
Barbiturate screen, urine: NEGATIVE ng/mL
Cocaine (Metab.): NEGATIVE ng/mL
OPIATE SCREEN URINE: NEGATIVE ng/mL
Oxycodone/Oxymorphone, Urine: NEGATIVE ng/mL
PCP Quant, Ur: NEGATIVE ng/mL

## 2022-03-03 LAB — CANNABINOID CONFIRMATION, UR
CANNABINOIDS: POSITIVE — AB
Carboxy THC GC/MS Conf: 458 ng/mL

## 2022-03-14 ENCOUNTER — Emergency Department
Admission: EM | Admit: 2022-03-14 | Discharge: 2022-03-14 | Disposition: A | Discharge status: Home / Self Care | Payer: Medicaid Other | Attending: Emergency Medicine | Admitting: Emergency Medicine

## 2022-03-14 ENCOUNTER — Emergency Department: Payer: Medicaid Other

## 2022-03-14 ENCOUNTER — Encounter: Payer: Self-pay | Admitting: Emergency Medicine

## 2022-03-14 ENCOUNTER — Other Ambulatory Visit: Payer: Self-pay

## 2022-03-14 DIAGNOSIS — O26892 Other specified pregnancy related conditions, second trimester: Secondary | ICD-10-CM | POA: Insufficient documentation

## 2022-03-14 DIAGNOSIS — R109 Unspecified abdominal pain: Secondary | ICD-10-CM | POA: Diagnosis not present

## 2022-03-14 DIAGNOSIS — Z3A19 19 weeks gestation of pregnancy: Secondary | ICD-10-CM | POA: Insufficient documentation

## 2022-03-14 DIAGNOSIS — Z6740 Type O blood, Rh positive: Secondary | ICD-10-CM | POA: Diagnosis not present

## 2022-03-14 DIAGNOSIS — O4290 Premature rupture of membranes, unspecified as to length of time between rupture and onset of labor, unspecified weeks of gestation: Secondary | ICD-10-CM

## 2022-03-14 DIAGNOSIS — D72829 Elevated white blood cell count, unspecified: Secondary | ICD-10-CM | POA: Insufficient documentation

## 2022-03-14 DIAGNOSIS — R102 Pelvic and perineal pain: Secondary | ICD-10-CM

## 2022-03-14 DIAGNOSIS — O99112 Other diseases of the blood and blood-forming organs and certain disorders involving the immune mechanism complicating pregnancy, second trimester: Secondary | ICD-10-CM | POA: Insufficient documentation

## 2022-03-14 DIAGNOSIS — O26899 Other specified pregnancy related conditions, unspecified trimester: Secondary | ICD-10-CM

## 2022-03-14 LAB — CBC WITH DIFFERENTIAL/PLATELET
Abs Immature Granulocytes: 0.06 10*3/uL (ref 0.00–0.07)
Basophils Absolute: 0 10*3/uL (ref 0.0–0.1)
Basophils Relative: 0 %
Eosinophils Absolute: 0.1 10*3/uL (ref 0.0–0.5)
Eosinophils Relative: 1 %
HCT: 34.4 % — ABNORMAL LOW (ref 36.0–46.0)
Hemoglobin: 11.5 g/dL — ABNORMAL LOW (ref 12.0–15.0)
Immature Granulocytes: 1 %
Lymphocytes Relative: 25 %
Lymphs Abs: 2.7 10*3/uL (ref 0.7–4.0)
MCH: 27.7 pg (ref 26.0–34.0)
MCHC: 33.4 g/dL (ref 30.0–36.0)
MCV: 82.9 fL (ref 80.0–100.0)
Monocytes Absolute: 0.6 10*3/uL (ref 0.1–1.0)
Monocytes Relative: 5 %
Neutro Abs: 7.3 10*3/uL (ref 1.7–7.7)
Neutrophils Relative %: 68 %
Platelets: 261 10*3/uL (ref 150–400)
RBC: 4.15 MIL/uL (ref 3.87–5.11)
RDW: 14.6 % (ref 11.5–15.5)
WBC: 10.7 10*3/uL — ABNORMAL HIGH (ref 4.0–10.5)
nRBC: 0 % (ref 0.0–0.2)

## 2022-03-14 LAB — BASIC METABOLIC PANEL
Anion gap: 8 (ref 5–15)
BUN: <5 mg/dL — ABNORMAL LOW (ref 6–20)
CO2: 20 mmol/L — ABNORMAL LOW (ref 22–32)
Calcium: 8.8 mg/dL — ABNORMAL LOW (ref 8.9–10.3)
Chloride: 107 mmol/L (ref 98–111)
Creatinine, Ser: 0.52 mg/dL (ref 0.44–1.00)
GFR, Estimated: >60 mL/min (ref >60)
Glucose, Bld: 96 mg/dL (ref 70–99)
Potassium: 3.6 mmol/L (ref 3.5–5.1)
Sodium: 135 mmol/L (ref 135–145)

## 2022-03-14 LAB — URINALYSIS, ROUTINE W REFLEX MICROSCOPIC
Bilirubin Urine: NEGATIVE
Glucose, UA: NEGATIVE mg/dL
Hgb urine dipstick: NEGATIVE
Ketones, ur: NEGATIVE mg/dL
Nitrite: NEGATIVE
Protein, ur: NEGATIVE mg/dL
Specific Gravity, Urine: 1.008 (ref 1.005–1.030)
pH: 6 (ref 5.0–8.0)

## 2022-03-14 LAB — HCG, QUANTITATIVE, PREGNANCY: hCG, Beta Chain, Quant, S: 15636 m[IU]/mL — ABNORMAL HIGH (ref <5)

## 2022-03-14 LAB — ABO/RH: ABO/RH(D): O POS

## 2022-03-14 MED ORDER — SODIUM CHLORIDE 0.9 % IV BOLUS
1000.0000 mL | Freq: Once | INTRAVENOUS | Status: AC
  Administered 2022-03-14: 1000 mL via INTRAVENOUS

## 2022-03-14 MED ORDER — METOCLOPRAMIDE HCL 5 MG/ML IJ SOLN
10.0000 mg | Freq: Once | INTRAMUSCULAR | Status: AC
  Administered 2022-03-14: 10 mg via INTRAVENOUS
  Filled 2022-03-14: qty 2

## 2022-03-14 NOTE — ED Notes (Signed)
Patient in US at this time

## 2022-03-14 NOTE — ED Triage Notes (Addendum)
Patient to ED c/o abdominal cramping and leaking clear fluids with thick white discharge. Hx of miscarriage. Also complaining of headache and lightheadedness. Denies vaginal bleeding. 18 weeks and 5 days pregnant- gets care at health department.

## 2022-03-14 NOTE — ED Provider Triage Note (Signed)
Emergency Medicine Provider Triage Evaluation Note  Julie Guzman , a 21 y.o. female  was evaluated in triage.  Pt complains of pelvic cramping, leaking fluid.  [redacted] weeks pregnant.  States she was seen at the health department but they are transferring her care to [facility] as she has history of several miscarriages and having children very early.  Review of Systems  Positive: See above Negative: Fever chills  Physical Exam  BP 107/88 (BP Location: Left Arm)   Pulse 89   Temp 98.5 F (36.9 C) (Oral)   Resp 18   Ht 5\' 1"  (1.549 m)   Wt 115.2 kg   LMP 11/25/2021 (Approximate)   SpO2 100%   BMI 47.99 kg/m  Gen:   Awake, no distress   Resp:  Normal effort  MSK:   Moves extremities without difficulty  Other:    Medical Decision Making  Medically screening exam initiated at 2:27 PM.  Appropriate orders placed.  LIBERTA GIMPEL was informed that the remainder of the evaluation will be completed by another provider, this initial triage assessment does not replace that evaluation, and the importance of remaining in the ED until their evaluation is complete.     Rulon Eisenmenger, PA-C 03/14/22 1428

## 2022-03-14 NOTE — ED Provider Notes (Signed)
[facility] Provider Note    Event Date/Time   First MD Initiated Contact with Patient 03/14/22 1522     (approximate)   History   Abdominal Cramping   HPI  Julie Guzman is a 21 y.o. female presents emergency department complaining of pelvic cramping and leaking fluid.  She is [redacted] weeks pregnant.  Receives her care at the health department has recently had her care transferred to [facility] as she states she is high risk due to multiple miscarriages along with preterm deliveries      Physical Exam   Triage Vital Signs: ED Triage Vitals [03/14/22 1425]  Enc Vitals Group     BP 107/88     Pulse Rate 89     Resp 18     Temp 98.5 F (36.9 C)     Temp Source Oral     SpO2 100 %     Weight 254 lb (115.2 kg)     Height 5\' 1"  (1.549 m)     Head Circumference      Peak Flow      Pain Score 8     Pain Loc      Pain Edu?      Excl. in [place]?     Most recent vital signs: Vitals:   03/14/22 1531 03/14/22 1714  BP: 110/80 118/78  Pulse: 89 80  Resp: 18 16  Temp:    SpO2: 100% 98%     General: Awake, no distress.   CV:  Good peripheral perfusion. regular rate and  rhythm Resp:  Normal effort.  Abd:  No distention.   Other:     ED Results / Procedures / Treatments   Labs (all labs ordered are listed, but only abnormal results are displayed) Labs Reviewed  BASIC METABOLIC PANEL - Abnormal; Notable for the following components:      Result Value   CO2 20 (*)    BUN <5 (*)    Calcium 8.8 (*)    All other components within normal limits  CBC WITH DIFFERENTIAL/PLATELET - Abnormal; Notable for the following components:   WBC 10.7 (*)    Hemoglobin 11.5 (*)    HCT 34.4 (*)    All other components within normal limits  HCG, QUANTITATIVE, PREGNANCY - Abnormal; Notable for the following components:   hCG, Beta Chain, Quant, S 15,636 (*)    All other components within normal limits  URINALYSIS, ROUTINE W REFLEX MICROSCOPIC - Abnormal; Notable for  the following components:   Color, Urine YELLOW (*)    APPearance CLOUDY (*)    Leukocytes,Ua TRACE (*)    Bacteria, UA RARE (*)    All other components within normal limits  POC URINE PREG, ED  ABO/RH     EKG     RADIOLOGY Ultrasound OB greater than 14 weeks    PROCEDURES:   Procedures   MEDICATIONS ORDERED IN ED: Medications  sodium chloride 0.9 % bolus 1,000 mL (0 mLs Intravenous Stopped 03/14/22 1713)  metoCLOPramide (REGLAN) injection 10 mg (10 mg Intravenous Given 03/14/22 1635)     IMPRESSION / MDM / ASSESSMENT AND PLAN / ED COURSE  I reviewed the triage vital signs and the nursing notes.                              Differential diagnosis includes, but is not limited to, miscarriage, threatened miscarriage, fetal demise, subchorionic hemorrhage  Patient's presentation is most consistent with acute complicated illness / injury requiring diagnostic workup.   Patient's labs are reassuring, CBC has been we will bump with a WBC of 10.7, ABO/Rh is O+ so patient will not need RhoGAM Metabolic panel is normal   I did review and interpret the ultrasound.  Ultrasound shows stable fetus.  Heart rate is 144 bpm.  I did explain the findings to the patient.  She was given saline 1 L IV along with Reglan for her headache.  Did explain to her that she should not be taking Flexeril or narcotics for pain.  She continues to ask for pain medication but I did tell her that this would drug the baby and would not be good for them.  She is agreeable to using Tylenol.  She was instructed to follow-up with her OB/GYN in which she has an appointment with on 8/17 and on 8/19.  She was discharged in stable condition.   FINAL CLINICAL IMPRESSION(S) / ED DIAGNOSES   Final diagnoses:  Abdominal pain in pregnancy, antepartum     Rx / DC Orders   ED Discharge Orders     None        Note:  This document was prepared using Dragon voice recognition software and may include  unintentional dictation errors.    Faythe Ghee, PA-C 03/14/22 2000    Concha Se, MD 03/15/22 [phone]

## 2022-03-16 DIAGNOSIS — O1412 Severe pre-eclampsia, second trimester: Secondary | ICD-10-CM | POA: Insufficient documentation

## 2022-03-16 DIAGNOSIS — F419 Anxiety disorder, unspecified: Secondary | ICD-10-CM

## 2022-03-16 DIAGNOSIS — O26879 Cervical shortening, unspecified trimester: Secondary | ICD-10-CM | POA: Insufficient documentation

## 2022-03-16 HISTORY — DX: Anxiety disorder, unspecified: F41.9

## 2022-03-23 ENCOUNTER — Ambulatory Visit: Payer: Medicaid Other

## 2022-03-27 ENCOUNTER — Ambulatory Visit: Payer: Medicaid Other

## 2022-04-24 ENCOUNTER — Other Ambulatory Visit: Payer: Self-pay

## 2022-04-24 ENCOUNTER — Emergency Department
Admission: EM | Admit: 2022-04-24 | Discharge: 2022-04-24 | Disposition: A | Discharge status: Home / Self Care | Payer: Medicaid Other | Attending: Emergency Medicine | Admitting: Emergency Medicine

## 2022-04-24 DIAGNOSIS — L292 Pruritus vulvae: Secondary | ICD-10-CM | POA: Insufficient documentation

## 2022-04-24 DIAGNOSIS — O23592 Infection of other part of genital tract in pregnancy, second trimester: Secondary | ICD-10-CM | POA: Insufficient documentation

## 2022-04-24 DIAGNOSIS — Z3A00 Weeks of gestation of pregnancy not specified: Secondary | ICD-10-CM | POA: Insufficient documentation

## 2022-04-24 DIAGNOSIS — N898 Other specified noninflammatory disorders of vagina: Secondary | ICD-10-CM

## 2022-04-24 LAB — WET PREP, GENITAL
Clue Cells Wet Prep HPF POC: NONE SEEN
Sperm: NONE SEEN
Trich, Wet Prep: NONE SEEN
WBC, Wet Prep HPF POC: <10 (ref <10)
Yeast Wet Prep HPF POC: NONE SEEN

## 2022-04-24 MED ORDER — ASPIRIN 81 MG PO TBEC: 81.0000 mg | DELAYED_RELEASE_TABLET | Freq: Every day | ORAL | 2 refills | Status: AC

## 2022-04-24 NOTE — ED Provider Notes (Signed)
[facility] Provider Note    Event Date/Time   First MD Initiated Contact with Patient 04/24/22 1256     (approximate)   History   Vaginal Itching   HPI  Julie Guzman is a 21 y.o. female who is approximately 6 months pregnant presents emergency department complaining of vaginal itching.  Patient states she does not burning with urination she just itches after wiping.  Patient states it is mostly around the clitoris and not the vaginal area.  She denies any fever or chills.  No vaginal discharge.      Physical Exam   Triage Vital Signs: ED Triage Vitals  Enc Vitals Group     BP 04/24/22 1231 (!) 151/97     Pulse Rate 04/24/22 1231 99     Resp 04/24/22 1231 18     Temp 04/24/22 1231 98.3 F (36.8 C)     Temp Source 04/24/22 1231 Oral     SpO2 04/24/22 1231 99 %     Weight 04/24/22 1233 260 lb (117.9 kg)     Height 04/24/22 1233 5\' 1"  (1.549 m)     Head Circumference --      Peak Flow --      Pain Score 04/24/22 1233 5     Pain Loc --      Pain Edu? --      Excl. in [place]? --     Most recent vital signs: Vitals:   04/24/22 1231  BP: (!) 151/97  Pulse: 99  Resp: 18  Temp: 98.3 F (36.8 C)  SpO2: 99%     General: Awake, no distress.   CV:  Good peripheral perfusion. regular rate and  rhythm Resp:  Normal effort.  Abd:  No distention.   Other:      ED Results / Procedures / Treatments   Labs (all labs ordered are listed, but only abnormal results are displayed) Labs Reviewed  WET PREP, GENITAL     EKG     RADIOLOGY     PROCEDURES:   Procedures   MEDICATIONS ORDERED IN ED: Medications - No data to display   IMPRESSION / MDM / [place] / ED COURSE  I reviewed the triage vital signs and the nursing notes.                              Differential diagnosis includes, but is not limited to, yeast, bacterial vaginosis, vaginal dryness  Patient's presentation is most consistent with acute  complicated illness / injury requiring diagnostic workup.   Wet prep is negative for yeast, bacterial vaginosis or trichomoniasis  I did explain the findings to the patient.  She is deferring the vaginal exam at this time.  She is to follow-up with her regular doctor.  Take a probiotic or eat yogurt.  She could also try over-the-counter Vagisil in the external area.  She was also given a refill of her 81 mg aspirin that she is supposed to be taking daily due to preeclampsia etc.  She does have a doctor's order for it.  She was discharged in stable condition with instructions to follow-up with her regular doctor.      FINAL CLINICAL IMPRESSION(S) / ED DIAGNOSES   Final diagnoses:  Vaginal itching     Rx / DC Orders   ED Discharge Orders          Ordered    aspirin  EC 81 MG tablet  Daily        04/24/22 1348             Note:  This document was prepared using Dragon voice recognition software and may include unintentional dictation errors.    Versie Starks, PA-C 04/24/22 1356    Rada Hay, MD 04/24/22 [phone]

## 2022-04-24 NOTE — Discharge Instructions (Signed)
Follow-up with your regular doctor.  Try eating yogurt or take a probiotic this may help regulate the vaginal area.  You did not have any yeast on your wet prep.  You does not have any bacterial vaginosis or other terms.

## 2022-04-24 NOTE — ED Triage Notes (Signed)
Pt arrives with c/o vaginal itching that started 4 days ago. Pt denies discharge.

## 2022-04-27 ENCOUNTER — Emergency Department
Admission: EM | Admit: 2022-04-27 | Discharge: 2022-04-27 | Disposition: A | Discharge status: Home / Self Care | Payer: Medicaid Other | Attending: Emergency Medicine | Admitting: Emergency Medicine

## 2022-04-27 DIAGNOSIS — B3731 Acute candidiasis of vulva and vagina: Secondary | ICD-10-CM | POA: Insufficient documentation

## 2022-04-27 DIAGNOSIS — O23599 Infection of other part of genital tract in pregnancy, unspecified trimester: Secondary | ICD-10-CM | POA: Insufficient documentation

## 2022-04-27 DIAGNOSIS — Z3A00 Weeks of gestation of pregnancy not specified: Secondary | ICD-10-CM | POA: Insufficient documentation

## 2022-04-27 LAB — WET PREP, GENITAL
Clue Cells Wet Prep HPF POC: NONE SEEN
Sperm: NONE SEEN
Trich, Wet Prep: NONE SEEN
WBC, Wet Prep HPF POC: <10 (ref <10)

## 2022-04-27 LAB — CHLAMYDIA/NGC RT PCR (ARMC ONLY)
Chlamydia Tr: NOT DETECTED
N gonorrhoeae: NOT DETECTED

## 2022-04-27 MED ORDER — MICONAZOLE NITRATE 200 MG VA SUPP: 200.0000 mg | Freq: Every day | VAGINAL | 0 refills | Status: AC

## 2022-04-27 NOTE — ED Provider Notes (Signed)
[facility] Provider Note    Event Date/Time   First MD Initiated Contact with Patient 04/27/22 1536     (approximate)   History   Vaginal Itching (Was seen here for yeast issues , problem not solved )   HPI  Julie Guzman is a 21 y.o. female presents emergency department complaining of continued vaginal itching.  Has been using Vagisil without any relief.  Patient is pregnant.  States now she is unsure if it could be a STD.  No fever or chills.      Physical Exam   Triage Vital Signs: ED Triage Vitals  Enc Vitals Group     BP 04/27/22 1408 (!) 124/91     Pulse Rate 04/27/22 1407 60     Resp 04/27/22 1407 17     Temp 04/27/22 1407 98.6 F (37 C)     Temp Source 04/27/22 1407 Oral     SpO2 04/27/22 1407 98 %     Weight 04/27/22 1408 275 lb 9.2 oz (125 kg)     Height 04/27/22 1408 5\' 1"  (1.549 m)     Head Circumference --      Peak Flow --      Pain Score --      Pain Loc --      Pain Edu? --      Excl. in [place]? --     Most recent vital signs: Vitals:   04/27/22 1407 04/27/22 1408  BP:  (!) 124/91  Pulse: 60   Resp: 17   Temp: 98.6 F (37 C)   SpO2: 98%      General: Awake, no distress.   CV:  Good peripheral perfusion. regular rate and  rhythm Resp:  Normal effort.  Abd:  No distention.   Other:  No external lesions noted on the pelvic exam, thick white discharge noted in the vault, cervix is normal, no chandelier sign   ED Results / Procedures / Treatments   Labs (all labs ordered are listed, but only abnormal results are displayed) Labs Reviewed  WET PREP, GENITAL - Abnormal; Notable for the following components:      Result Value   Yeast Wet Prep HPF POC PRESENT (*)    All other components within normal limits  CHLAMYDIA/NGC RT PCR ([facility] ONLY)               EKG     RADIOLOGY     PROCEDURES:   Procedures   MEDICATIONS ORDERED IN ED: Medications - No data to display   IMPRESSION / MDM / [place] / ED COURSE  I reviewed the triage vital signs and the nursing notes.                              Differential diagnosis includes, but is not limited to, yeast, bacterial vaginosis, trichomoniasis, STD  Patient's presentation is most consistent with acute complicated illness / injury requiring diagnostic workup.   Wet prep/GC/chlamydia test ordered   Wet prep shows yeast, GC/chlamydia test still pending  I did explain these findings to the patient.  She was given a vaginal suppository for yeast.  She is to follow-up with her regular doctor if not improving in 3 days.  Return if worsening.  She is in agreement treatment plan.  Discharged stable condition.   FINAL CLINICAL IMPRESSION(S) / ED DIAGNOSES   Final diagnoses:  Vaginal yeast infection  Rx / DC Orders   ED Discharge Orders          Ordered    miconazole (MICOTIN) 200 MG vaginal suppository  Daily at bedtime        04/27/22 1814             Note:  This document was prepared using Dragon voice recognition software and may include unintentional dictation errors.    Faythe Ghee, PA-C 04/27/22 1815    Sharman Cheek, MD 04/28/22 1910

## 2022-04-27 NOTE — ED Triage Notes (Signed)
Was seen here for yeast issues , problem not solved , no vaginal discharge ,

## 2022-04-27 NOTE — Discharge Instructions (Signed)
Follow-up with your regular doctor as needed.  Return emergency department worsening.  Use medication as prescribed.  Your gonorrhea/chlamydia test will result in approximately 2 hours.  He can see this result on [facility] MyChart.  If one of them is positive return emergency department for treatment.

## 2022-05-08 ENCOUNTER — Observation Stay
Admission: EM | Admit: 2022-05-08 | Discharge: 2022-05-08 | Disposition: A | Discharge status: Home / Self Care | Payer: Medicaid Other | Attending: Obstetrics and Gynecology | Admitting: Obstetrics and Gynecology

## 2022-05-08 DIAGNOSIS — O0992 Supervision of high risk pregnancy, unspecified, second trimester: Secondary | ICD-10-CM | POA: Insufficient documentation

## 2022-05-08 DIAGNOSIS — R519 Headache, unspecified: Secondary | ICD-10-CM | POA: Insufficient documentation

## 2022-05-08 DIAGNOSIS — O468X1 Other antepartum hemorrhage, first trimester: Secondary | ICD-10-CM

## 2022-05-08 DIAGNOSIS — Z86711 Personal history of pulmonary embolism: Secondary | ICD-10-CM | POA: Insufficient documentation

## 2022-05-08 DIAGNOSIS — O0990 Supervision of high risk pregnancy, unspecified, unspecified trimester: Secondary | ICD-10-CM

## 2022-05-08 DIAGNOSIS — O99891 Other specified diseases and conditions complicating pregnancy: Secondary | ICD-10-CM | POA: Diagnosis not present

## 2022-05-08 DIAGNOSIS — Z79899 Other long term (current) drug therapy: Secondary | ICD-10-CM | POA: Diagnosis not present

## 2022-05-08 DIAGNOSIS — O4702 False labor before 37 completed weeks of gestation, second trimester: Principal | ICD-10-CM | POA: Insufficient documentation

## 2022-05-08 DIAGNOSIS — Z7982 Long term (current) use of aspirin: Secondary | ICD-10-CM | POA: Insufficient documentation

## 2022-05-08 DIAGNOSIS — Z7901 Long term (current) use of anticoagulants: Secondary | ICD-10-CM | POA: Diagnosis not present

## 2022-05-08 DIAGNOSIS — Z98891 History of uterine scar from previous surgery: Secondary | ICD-10-CM | POA: Diagnosis not present

## 2022-05-08 DIAGNOSIS — Z8759 Personal history of other complications of pregnancy, childbirth and the puerperium: Secondary | ICD-10-CM | POA: Insufficient documentation

## 2022-05-08 DIAGNOSIS — Z3A26 26 weeks gestation of pregnancy: Secondary | ICD-10-CM | POA: Diagnosis not present

## 2022-05-08 DIAGNOSIS — F1290 Cannabis use, unspecified, uncomplicated: Secondary | ICD-10-CM

## 2022-05-08 DIAGNOSIS — O4192X0 Disorder of amniotic fluid and membranes, unspecified, second trimester, not applicable or unspecified: Secondary | ICD-10-CM | POA: Diagnosis not present

## 2022-05-08 DIAGNOSIS — H538 Other visual disturbances: Secondary | ICD-10-CM | POA: Diagnosis not present

## 2022-05-08 DIAGNOSIS — Z87891 Personal history of nicotine dependence: Secondary | ICD-10-CM | POA: Diagnosis not present

## 2022-05-08 DIAGNOSIS — O99210 Obesity complicating pregnancy, unspecified trimester: Secondary | ICD-10-CM

## 2022-05-08 DIAGNOSIS — O09899 Supervision of other high risk pregnancies, unspecified trimester: Secondary | ICD-10-CM

## 2022-05-08 DIAGNOSIS — O99810 Abnormal glucose complicating pregnancy: Secondary | ICD-10-CM

## 2022-05-08 LAB — URINE DRUG SCREEN, QUALITATIVE (ARMC ONLY)
Amphetamines, Ur Screen: NOT DETECTED
Barbiturates, Ur Screen: NOT DETECTED
Benzodiazepine, Ur Scrn: NOT DETECTED
Cannabinoid 50 Ng, Ur ~~LOC~~: POSITIVE — AB
Cocaine Metabolite,Ur ~~LOC~~: NOT DETECTED
MDMA (Ecstasy)Ur Screen: NOT DETECTED
Methadone Scn, Ur: NOT DETECTED
Opiate, Ur Screen: NOT DETECTED
Phencyclidine (PCP) Ur S: NOT DETECTED
Tricyclic, Ur Screen: NOT DETECTED

## 2022-05-08 LAB — CHLAMYDIA/NGC RT PCR (ARMC ONLY)
Chlamydia Tr: NOT DETECTED
N gonorrhoeae: NOT DETECTED

## 2022-05-08 LAB — CBC
HCT: 32.3 % — ABNORMAL LOW (ref 36.0–46.0)
Hemoglobin: 10.8 g/dL — ABNORMAL LOW (ref 12.0–15.0)
MCH: 27.3 pg (ref 26.0–34.0)
MCHC: 33.4 g/dL (ref 30.0–36.0)
MCV: 81.6 fL (ref 80.0–100.0)
Platelets: 279 10*3/uL (ref 150–400)
RBC: 3.96 MIL/uL (ref 3.87–5.11)
RDW: 14.3 % (ref 11.5–15.5)
WBC: 11.7 10*3/uL — ABNORMAL HIGH (ref 4.0–10.5)
nRBC: 0 % (ref 0.0–0.2)

## 2022-05-08 LAB — WET PREP, GENITAL
Clue Cells Wet Prep HPF POC: NONE SEEN
Sperm: NONE SEEN
Trich, Wet Prep: NONE SEEN
WBC, Wet Prep HPF POC: >=10 — AB (ref <10)
Yeast Wet Prep HPF POC: NONE SEEN

## 2022-05-08 LAB — COMPREHENSIVE METABOLIC PANEL
ALT: 13 U/L (ref 0–44)
AST: 17 U/L (ref 15–41)
Albumin: 2.7 g/dL — ABNORMAL LOW (ref 3.5–5.0)
Alkaline Phosphatase: 67 U/L (ref 38–126)
Anion gap: 6 (ref 5–15)
BUN: 12 mg/dL (ref 6–20)
CO2: 20 mmol/L — ABNORMAL LOW (ref 22–32)
Calcium: 8.6 mg/dL — ABNORMAL LOW (ref 8.9–10.3)
Chloride: 111 mmol/L (ref 98–111)
Creatinine, Ser: 0.7 mg/dL (ref 0.44–1.00)
GFR, Estimated: >60 mL/min (ref >60)
Glucose, Bld: 98 mg/dL (ref 70–99)
Potassium: 4 mmol/L (ref 3.5–5.1)
Sodium: 137 mmol/L (ref 135–145)
Total Bilirubin: 0.4 mg/dL (ref 0.3–1.2)
Total Protein: 6.5 g/dL (ref 6.5–8.1)

## 2022-05-08 LAB — URINALYSIS, COMPLETE (UACMP) WITH MICROSCOPIC
Bilirubin Urine: NEGATIVE
Glucose, UA: NEGATIVE mg/dL
Hgb urine dipstick: NEGATIVE
Ketones, ur: NEGATIVE mg/dL
Leukocytes,Ua: NEGATIVE
Nitrite: NEGATIVE
Protein, ur: 30 mg/dL — AB
Specific Gravity, Urine: 1.019 (ref 1.005–1.030)
pH: 6 (ref 5.0–8.0)

## 2022-05-08 LAB — FETAL FIBRONECTIN: Fetal Fibronectin: NEGATIVE

## 2022-05-08 LAB — PROTEIN / CREATININE RATIO, URINE
Creatinine, Urine: 173 mg/dL
Protein Creatinine Ratio: 0.25 mg/mg{Cre} — ABNORMAL HIGH (ref 0.00–0.15)
Total Protein, Urine: 43 mg/dL

## 2022-05-08 NOTE — OB Triage Provider Note (Signed)
Julie Guzman is a 21 y.o. female. She is at [redacted]w[redacted]d gestation. Patient's last menstrual period was 11/25/2021 (approximate). Estimated Date of Delivery: 08/14/22  Prenatal care site: [facility] w/ transfer to [facility] high risk OB   Current pregnancy complicated by:  Morbid Obesity, BMI 46.8 Hx IUFD x 2 at 24wks (2020 and 2021) Hx preE postpartum 11/2020, on baby ASA Preterm labor and LTCS for fetal distress 11/2020, 28wks Varicella NON-immune Hx Pulmonary embolus 11/2020 (postpartum)- on Lovenox Hx MJ use   Chief complaint: contractions since 3 am about every 15 mins. Has been peeing a lot and when she coughs fluid comes out. Denies bleeding. Reports HA and visual changes.    S: Resting comfortably.  no VB.no LOF,  Active fetal movement. Denies:  SOB, or RUQ/epigastric pain  Maternal Medical History:   Past Medical History:  Diagnosis Date   Headache    Infection    Pre-eclampsia affecting childbirth    Pregnancy induced hypertension    Preterm labor    Recurrent upper respiratory infection (URI)     Past Surgical History:  Procedure Laterality Date   CESAREAN SECTION      No Known Allergies  Prior to Admission medications   Medication Sig Start Date End Date Taking? Authorizing Provider  miconazole (MICOTIN) 200 MG vaginal suppository Place 1 suppository (200 mg total) vaginally at bedtime. 04/27/22  Yes Fisher, Roselyn Bering, PA-C  sertraline (ZOLOFT) 25 MG tablet Take 25 mg by mouth daily. 03/17/22  Yes [provider]  aspirin EC 81 MG tablet Take 1 tablet (81 mg total) by mouth daily. Swallow whole. 04/24/22 04/24/23  Faythe Ghee, PA-C      Social History: She  reports that she has quit smoking. Her smoking use included cigarettes. She smoked an average of .25 packs per day. She has never been exposed to tobacco smoke. She has never used smokeless tobacco. She reports that she does not currently use alcohol after a past usage of about 3.0 standard drinks of alcohol per week.  She reports current drug use. Frequency: 14.00 times per week. Drug: Marijuana.  Family History: family history includes COPD in her mother; Cancer in her maternal grandfather; Diabetes in her maternal aunt and mother; Healthy in her brother, sister, and son; Heart disease in her paternal grandfather; Ovarian cancer in her maternal grandmother; Seizures in her father.   Review of Systems: A full review of systems was performed and negative except as noted in the HPI.     O:  BP (!) 146/79   Pulse (!) 59   Temp 98.8 F (37.1 C) (Oral)   Resp 20   LMP 11/25/2021 (Approximate)  No results found for this or any previous visit (from the past 48 hour(s)).   Constitutional: NAD, AAOx3  HE/ENT: extraocular movements grossly intact, moist mucous membranes CV: RRR PULM: nl respiratory effort, CTABL     Abd: gravid, non-tender, non-distended, soft      Ext: Non-tender, Nonedematous   Psych: mood appropriate, speech normal Pelvic: SSE done: no erythema, mod amount white DC noted. Cervix visually closed, no LOF noted.  SVE: cervix closed, soft, posterior.   Fetal  monitoring: Cat I  Appropriate for GA Baseline: 145bpm Variability: moderate Accelerations: absent Decelerations absent  TOCO: No UCs tracing, fundus soft, nontender.     A/P: 21 y.o. [redacted]w[redacted]d here for antenatal surveillance for preterm contractions, history Pre-E and preterm labor  Principle Diagnosis:  High risk pregnancy in third trimester   Fetal  Wellbeing: Reassuring Cat 1 tracing. Elevated BP: CMP, CBC and P/C ratio obtained and pending.  Preterm contractions: FFN sent, wet prep, GC/CT, UA and UDS. Difficulty tracing via toco due to habitus, will monitor closely.  Dr Glennon Mac updated regarding pt status.    Francetta Found, CNM 05/08/2022  9:03 AM

## 2022-05-08 NOTE — Progress Notes (Signed)
Upon discharging patient she expressed that she has been taking naproxen every 6 hours for 2 weeks and does not drink water. She was educated on stopping naproxen and how much water to drink in a day. Patient verbalized understanding and will make follow up with [facility].

## 2022-05-08 NOTE — OB Triage Note (Signed)
Patient here for contractions since 3 am about every 15 mins. Has been peeing a lot and when she cughs fluid comes out. Denies bleeding .

## 2022-05-08 NOTE — Discharge Summary (Signed)
Julie Guzman is a 21 y.o. female. She is at [redacted]w[redacted]d gestation. Patient's last menstrual period was 11/25/2021 (approximate). Estimated Date of Delivery: 08/14/22  Prenatal care site: [facility] w/ transfer to [facility] high risk OB   Current pregnancy complicated by:  Morbid Obesity, BMI 46.8 Hx IUFD x 2 at 24wks (2020 and 2021) Hx preE postpartum 11/2020, on baby ASA Preterm labor and LTCS for fetal distress 11/2020, 28wks Varicella NON-immune Hx Pulmonary embolus 11/2020 (postpartum)- on Lovenox Hx MJ use   Chief complaint: contractions since 3 am about every 15 mins. Has been peeing a lot and when she coughs fluid comes out. Denies bleeding. Reports HA and visual changes.    S: Resting comfortably.  no VB.no LOF,  Active fetal movement. Denies:  SOB, or RUQ/epigastric pain  Maternal Medical History:   Past Medical History:  Diagnosis Date   Headache    Infection    Pre-eclampsia affecting childbirth    Pregnancy induced hypertension    Preterm labor    Recurrent upper respiratory infection (URI)     Past Surgical History:  Procedure Laterality Date   CESAREAN SECTION      No Known Allergies  Prior to Admission medications   Medication Sig Start Date End Date Taking? Authorizing Provider  miconazole (MICOTIN) 200 MG vaginal suppository Place 1 suppository (200 mg total) vaginally at bedtime. 04/27/22  Yes Fisher, Roselyn Bering, PA-C  sertraline (ZOLOFT) 25 MG tablet Take 25 mg by mouth daily. 03/17/22  Yes [provider]  aspirin EC 81 MG tablet Take 1 tablet (81 mg total) by mouth daily. Swallow whole. 04/24/22 04/24/23  Faythe Ghee, PA-C      Social History: She  reports that she has quit smoking. Her smoking use included cigarettes. She smoked an average of .25 packs per day. She has never been exposed to tobacco smoke. She has never used smokeless tobacco. She reports that she does not currently use alcohol after a past usage of about 3.0 standard drinks of alcohol per week.  She reports current drug use. Frequency: 14.00 times per week. Drug: Marijuana.  Family History: family history includes COPD in her mother; Cancer in her maternal grandfather; Diabetes in her maternal aunt and mother; Healthy in her brother, sister, and son; Heart disease in her paternal grandfather; Ovarian cancer in her maternal grandmother; Seizures in her father.   Review of Systems: A full review of systems was performed and negative except as noted in the HPI.     O:  BP (!) 146/79   Pulse (!) 59   Temp 98.8 F (37.1 C) (Oral)   Resp 20   LMP 11/25/2021 (Approximate)  Results for orders placed or performed during the hospital encounter of 05/08/22 (from the past 48 hour(s))  CBC   Collection Time: 05/08/22  8:44 AM  Result Value Ref Range   WBC 11.7 (H) 4.0 - 10.5 K/uL   RBC 3.96 3.87 - 5.11 MIL/uL   Hemoglobin 10.8 (L) 12.0 - 15.0 g/dL   HCT 92.0 (L) 10.0 - 71.2 %   MCV 81.6 80.0 - 100.0 fL   MCH 27.3 26.0 - 34.0 pg   MCHC 33.4 30.0 - 36.0 g/dL   RDW 19.7 58.8 - 32.5 %   Platelets 279 150 - 400 K/uL   nRBC 0.0 0.0 - 0.2 %  Comprehensive metabolic panel   Collection Time: 05/08/22  8:44 AM  Result Value Ref Range   Sodium 137 135 - 145 mmol/L  Potassium 4.0 3.5 - 5.1 mmol/L   Chloride 111 98 - 111 mmol/L   CO2 20 (L) 22 - 32 mmol/L   Glucose, Bld 98 70 - 99 mg/dL   BUN 12 6 - 20 mg/dL   Creatinine, Ser 0.70 0.44 - 1.00 mg/dL   Calcium 8.6 (L) 8.9 - 10.3 mg/dL   Total Protein 6.5 6.5 - 8.1 g/dL   Albumin 2.7 (L) 3.5 - 5.0 g/dL   AST 17 15 - 41 U/L   ALT 13 0 - 44 U/L   Alkaline Phosphatase 67 38 - 126 U/L   Total Bilirubin 0.4 0.3 - 1.2 mg/dL   GFR, Estimated >60 >60 mL/min   Anion gap 6 5 - 15  Protein / creatinine ratio, urine   Collection Time: 05/08/22  8:46 AM  Result Value Ref Range   Creatinine, Urine 173 mg/dL   Total Protein, Urine 43 mg/dL   Protein Creatinine Ratio 0.25 (H) 0.00 - 0.15 mg/mg[Cre]  Urine Drug Screen, Qualitative ([facility] only)    Collection Time: 05/08/22  8:46 AM  Result Value Ref Range   Tricyclic, Ur Screen NONE DETECTED NONE DETECTED   Amphetamines, Ur Screen NONE DETECTED NONE DETECTED   MDMA (Ecstasy)Ur Screen NONE DETECTED NONE DETECTED   Cocaine Metabolite,Ur [place] NONE DETECTED NONE DETECTED   Opiate, Ur Screen NONE DETECTED NONE DETECTED   Phencyclidine (PCP) Ur S NONE DETECTED NONE DETECTED   Cannabinoid 50 Ng, Ur [place] POSITIVE (A) NONE DETECTED   Barbiturates, Ur Screen NONE DETECTED NONE DETECTED   Benzodiazepine, Ur Scrn NONE DETECTED NONE DETECTED   Methadone Scn, Ur NONE DETECTED NONE DETECTED  Urinalysis, Complete w Microscopic Urine, Clean Catch   Collection Time: 05/08/22  8:46 AM  Result Value Ref Range   Color, Urine YELLOW (A) YELLOW   APPearance HAZY (A) CLEAR   Specific Gravity, Urine 1.019 1.005 - 1.030   pH 6.0 5.0 - 8.0   Glucose, UA NEGATIVE NEGATIVE mg/dL   Hgb urine dipstick NEGATIVE NEGATIVE   Bilirubin Urine NEGATIVE NEGATIVE   Ketones, ur NEGATIVE NEGATIVE mg/dL   Protein, ur 30 (A) NEGATIVE mg/dL   Nitrite NEGATIVE NEGATIVE   Leukocytes,Ua NEGATIVE NEGATIVE   WBC, UA 0-5 0 - 5 WBC/hpf   Bacteria, UA RARE (A) NONE SEEN   Squamous Epithelial / LPF 6-10 0 - 5   Mucus PRESENT   Wet prep, genital   Collection Time: 05/08/22  8:47 AM   Specimen: Urine, Clean Catch  Result Value Ref Range   Yeast Wet Prep HPF POC NONE SEEN NONE SEEN   Trich, Wet Prep NONE SEEN NONE SEEN   Clue Cells Wet Prep HPF POC NONE SEEN NONE SEEN   WBC, Wet Prep HPF POC >=10 (A) <10   Sperm NONE SEEN   Fetal fibronectin   Collection Time: 05/08/22  8:52 AM  Result Value Ref Range   Fetal Fibronectin NEGATIVE NEGATIVE     Constitutional: NAD, AAOx3  HE/ENT: extraocular movements grossly intact, moist mucous membranes CV: RRR PULM: nl respiratory effort, CTABL     Abd: gravid, non-tender, non-distended, soft      Ext: Non-tender, Nonedematous   Psych: mood appropriate, speech normal Pelvic:  deferred repeat exam.    Fetal  monitoring: Cat I  Appropriate for GA Baseline: 145bpm Variability: moderate Accelerations: absent Decelerations absent  TOCO: No UCs tracing, fundus soft, nontender.     A/P: 21 y.o. [redacted]w[redacted]d here for antenatal surveillance for preterm contractions, history Pre-E and  preterm labor  Principle Diagnosis:  High risk pregnancy in third trimester   Fetal Wellbeing: Reassuring Cat 1 tracing. Elevated BP: CMP, CBC and P/C ratio all WNL. Needs close f/u with [facility], discussed Pre-E precautions.  Preterm contractions: FFN neg, wet prep neg. No e/o acute active preterm labor.  DC home, discussed importance of f/u with prenatal care provider due to high risk status.     Randa Ngo, CNM 05/08/2022  10:21 AM

## 2022-05-16 ENCOUNTER — Encounter: Payer: Self-pay | Admitting: Emergency Medicine

## 2022-05-16 ENCOUNTER — Other Ambulatory Visit: Payer: Self-pay

## 2022-05-16 DIAGNOSIS — D72829 Elevated white blood cell count, unspecified: Secondary | ICD-10-CM | POA: Diagnosis not present

## 2022-05-16 DIAGNOSIS — Z7982 Long term (current) use of aspirin: Secondary | ICD-10-CM | POA: Insufficient documentation

## 2022-05-16 DIAGNOSIS — I10 Essential (primary) hypertension: Secondary | ICD-10-CM | POA: Diagnosis not present

## 2022-05-16 DIAGNOSIS — G8929 Other chronic pain: Secondary | ICD-10-CM | POA: Insufficient documentation

## 2022-05-16 DIAGNOSIS — G8918 Other acute postprocedural pain: Secondary | ICD-10-CM | POA: Insufficient documentation

## 2022-05-16 DIAGNOSIS — R1030 Lower abdominal pain, unspecified: Secondary | ICD-10-CM | POA: Diagnosis not present

## 2022-05-16 DIAGNOSIS — M5440 Lumbago with sciatica, unspecified side: Secondary | ICD-10-CM | POA: Insufficient documentation

## 2022-05-16 DIAGNOSIS — M545 Low back pain, unspecified: Secondary | ICD-10-CM | POA: Diagnosis present

## 2022-05-16 MED ORDER — CYCLOBENZAPRINE HCL 10 MG PO TABS
10.0000 mg | ORAL_TABLET | Freq: Once | ORAL | Status: AC
  Administered 2022-05-16: 10 mg via ORAL
  Filled 2022-05-16: qty 1

## 2022-05-16 MED ORDER — NIFEDIPINE ER 60 MG PO TB24: 60.0000 mg | ORAL_TABLET | Freq: Once | ORAL | Status: DC

## 2022-05-16 MED ORDER — NIFEDIPINE ER OSMOTIC RELEASE 30 MG PO TB24: 30.0000 mg | ORAL_TABLET | Freq: Once | ORAL | Status: DC

## 2022-05-16 MED ORDER — ENALAPRIL MALEATE 10 MG PO TABS
10.0000 mg | ORAL_TABLET | Freq: Once | ORAL | Status: AC
  Administered 2022-05-17: 10 mg via ORAL
  Filled 2022-05-16: qty 1

## 2022-05-16 MED ORDER — OXYCODONE HCL 5 MG PO TABS
5.0000 mg | ORAL_TABLET | Freq: Once | ORAL | Status: AC
  Administered 2022-05-16: 5 mg via ORAL
  Filled 2022-05-16: qty 1

## 2022-05-16 MED ORDER — NIFEDIPINE ER 60 MG PO TB24
60.0000 mg | ORAL_TABLET | Freq: Once | ORAL | Status: AC
  Administered 2022-05-17: 60 mg via ORAL
  Filled 2022-05-16 (×2): qty 1

## 2022-05-16 NOTE — ED Triage Notes (Signed)
Pt to ED via EMS from home.  States was unable to get oxycodone and 2 BP medications filled from [facility].  Was released from [facility] today for recent C-section on Friday.  Hx of preeclampsia, G4P2.  Pt states vaginal bleeding today, filled 1 pad, bright red bleeding.  Pt tearful in triage.  PA in triage room for MSE.

## 2022-05-16 NOTE — ED Notes (Signed)
Retrieved breast pump and supplies for pt and placed in family room for privacy; fan supplied at request for comfort as well

## 2022-05-16 NOTE — ED Provider Triage Note (Addendum)
Emergency Medicine Provider Triage Evaluation Note  Julie Guzman, a 21 y.o. female  was evaluated in triage.  Pt complains of elevated blood pressure.  Patient was released from [facility] today, after C-section on day 4 postpartum.  She apparently received her medication doses prior to discharge.  She notes that her prescriptions were not submitted to the [facility] as expected and was likely to have prescription for her blood pressure medicine as well as her Percocet.  She presents to the ED tearful and upset in triage.  She voices concern for blood pressure and is upset that her medications have not been available to her she denies any chest pain or shortness of breath.  She does endorse some pain at her C-section site, but has been using witch hazel and Lidoderm patches as prescribed.    Review of Systems  Positive: Elevated BP Negative: CP, SOB  Physical Exam  BP (!) 167/125 (BP Location: Left Arm)   Pulse (!) 125   Temp 98.5 F (36.9 C) (Oral)   Resp (!) 24   Ht 5\' 1"  (1.549 m)   Wt 125 kg   LMP 11/25/2021 (Approximate)   SpO2 99%   Breastfeeding Unknown   BMI 52.07 kg/m  Gen:   Awake, no distress  Tearful and upset Resp:  Normal effort CTA MSK:   Moves extremities without difficulty  ABD:  Soft, nontender  Medical Decision Making  Medically screening exam initiated at 10:46 PM.  Appropriate orders placed.  Julie Guzman was informed that the remainder of the evaluation will be completed by another provider, this initial triage assessment does not replace that evaluation, and the importance of remaining in the ED until their evaluation is complete.  Postpartum patient to the ED for concern evaluation for elevated blood pressures.  Patient also reports her expected prescriptions were not available at the pharmacy as planned by [facility].   Melvenia Needles, PA-C 05/16/22 2245    Melvenia Needles, [place] 05/16/22 2246

## 2022-05-17 ENCOUNTER — Emergency Department
Admission: EM | Admit: 2022-05-17 | Discharge: 2022-05-17 | Disposition: A | Discharge status: Home / Self Care | Payer: Medicaid Other | Attending: Emergency Medicine | Admitting: Emergency Medicine

## 2022-05-17 DIAGNOSIS — G8918 Other acute postprocedural pain: Secondary | ICD-10-CM

## 2022-05-17 DIAGNOSIS — I10 Essential (primary) hypertension: Secondary | ICD-10-CM

## 2022-05-17 DIAGNOSIS — G8929 Other chronic pain: Secondary | ICD-10-CM

## 2022-05-17 LAB — CBC WITH DIFFERENTIAL/PLATELET
Abs Immature Granulocytes: 0.14 10*3/uL — ABNORMAL HIGH (ref 0.00–0.07)
Basophils Absolute: 0.1 10*3/uL (ref 0.0–0.1)
Basophils Relative: 0 %
Eosinophils Absolute: 0.2 10*3/uL (ref 0.0–0.5)
Eosinophils Relative: 1 %
HCT: 36.4 % (ref 36.0–46.0)
Hemoglobin: 11.5 g/dL — ABNORMAL LOW (ref 12.0–15.0)
Immature Granulocytes: 1 %
Lymphocytes Relative: 31 %
Lymphs Abs: 5 10*3/uL — ABNORMAL HIGH (ref 0.7–4.0)
MCH: 27.2 pg (ref 26.0–34.0)
MCHC: 31.6 g/dL (ref 30.0–36.0)
MCV: 86.1 fL (ref 80.0–100.0)
Monocytes Absolute: 0.8 10*3/uL (ref 0.1–1.0)
Monocytes Relative: 5 %
Neutro Abs: 9.9 10*3/uL — ABNORMAL HIGH (ref 1.7–7.7)
Neutrophils Relative %: 62 %
Platelets: 420 10*3/uL — ABNORMAL HIGH (ref 150–400)
RBC: 4.23 MIL/uL (ref 3.87–5.11)
RDW: 15.8 % — ABNORMAL HIGH (ref 11.5–15.5)
WBC: 16.1 10*3/uL — ABNORMAL HIGH (ref 4.0–10.5)
nRBC: 0 % (ref 0.0–0.2)

## 2022-05-17 LAB — COMPREHENSIVE METABOLIC PANEL
ALT: 26 U/L (ref 0–44)
AST: 30 U/L (ref 15–41)
Albumin: 3.2 g/dL — ABNORMAL LOW (ref 3.5–5.0)
Alkaline Phosphatase: 76 U/L (ref 38–126)
Anion gap: 12 (ref 5–15)
BUN: 14 mg/dL (ref 6–20)
CO2: 23 mmol/L (ref 22–32)
Calcium: 8.7 mg/dL — ABNORMAL LOW (ref 8.9–10.3)
Chloride: 102 mmol/L (ref 98–111)
Creatinine, Ser: 0.7 mg/dL (ref 0.44–1.00)
GFR, Estimated: >60 mL/min (ref >60)
Glucose, Bld: 96 mg/dL (ref 70–99)
Potassium: 4.8 mmol/L (ref 3.5–5.1)
Sodium: 137 mmol/L (ref 135–145)
Total Bilirubin: 0.8 mg/dL (ref 0.3–1.2)
Total Protein: 7.4 g/dL (ref 6.5–8.1)
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
## 2022-05-17 NOTE — Discharge Instructions (Addendum)
Steps to find a Primary Care Provider (PCP):  Call [phone] or [phone] to access "[place] Find a Doctor Service."  2.  You may also go on the [place] website at [email or web address]  

## 2022-05-21 ENCOUNTER — Other Ambulatory Visit: Payer: Self-pay

## 2022-05-21 ENCOUNTER — Emergency Department
Admission: EM | Admit: 2022-05-21 | Discharge: 2022-05-21 | Disposition: A | Discharge status: Home / Self Care | Payer: Medicaid Other | Attending: Emergency Medicine | Admitting: Emergency Medicine

## 2022-05-21 DIAGNOSIS — F419 Anxiety disorder, unspecified: Secondary | ICD-10-CM | POA: Diagnosis not present

## 2022-05-21 DIAGNOSIS — F418 Other specified anxiety disorders: Secondary | ICD-10-CM

## 2022-05-21 DIAGNOSIS — O99345 Other mental disorders complicating the puerperium: Secondary | ICD-10-CM | POA: Insufficient documentation

## 2022-05-21 MED ORDER — LIDOCAINE HCL URETHRAL/MUCOSAL 2 % EX GEL
1.0000 | Freq: Once | CUTANEOUS | Status: AC
  Administered 2022-05-21: 1
  Filled 2022-05-21: qty 6

## 2022-05-21 NOTE — ED Triage Notes (Signed)
Pt very anxious, states has a c section done 4 days ago and Is having pain at incision site, site appears without s/s of infection. Pt is grabbing this RN in triage due to anxiety, is breast feeding. Pt calms with coaching.

## 2022-05-21 NOTE — ED Provider Notes (Signed)
[facility] Provider Note    Event Date/Time   First MD Initiated Contact with Patient 05/21/22 [phone]     (approximate)   History   Abdominal Pain   HPI  Julie Guzman is a 21 y.o. female with history of PE, pregnancy-induced hypertension, preeclampsia, and remaining history as listed in the EMR presents to the emergency department with concern that her cesarean site is itching and she is having vaginal bleeding.  She delivered by cesarean on 05/12/22 secondary to superimposed preeclampsia on chronic, uncontrolled hypertension. Vaginal bleeding has been light and not as heavy as a period. She has had no pelvic pain/cramping. Area of cesarean has not had any noted drainage or redness. No fever. She states she has been taking her blood pressure medications as prescribed.      Physical Exam   Triage Vital Signs: ED Triage Vitals [05/21/22 0644]  Enc Vitals Group     BP 106/70     Pulse Rate 86     Resp 20     Temp 99.2 F (37.3 C)     Temp Source Oral     SpO2 100 %     Weight 290 lb (131.5 kg)     Height 5' (1.524 m)     Head Circumference      Peak Flow      Pain Score 10     Pain Loc      Pain Edu?      Excl. in [place]?     Most recent vital signs: Vitals:   05/21/22 0644  BP: 106/70  Pulse: 86  Resp: 20  Temp: 99.2 F (37.3 C)  SpO2: 100%     General: Awake, no distress.  CV:  Good peripheral perfusion.  Resp:  Normal effort.  Abd:  No distention.  Other:  Cesarean site well approximated and without surrounding erythema or drainage.  No tenderness.   ED Results / Procedures / Treatments   Labs (all labs ordered are listed, but only abnormal results are displayed) Labs Reviewed - No data to display   EKG  Not indicated   RADIOLOGY Not indicated   PROCEDURES:  Critical Care performed: No  Procedures   MEDICATIONS ORDERED IN ED: Medications  lidocaine (XYLOCAINE) 2 % jelly 1 Application (1 Application Other Given  05/21/22 0759)     IMPRESSION / MDM / [place] / ED COURSE  I reviewed the triage vital signs and the nursing notes.                              Differential diagnosis includes, but is not limited to postpartum vaginal bleeding, surgical site complication  Patient's presentation is most consistent with acute, uncomplicated illness.  21 year old female presenting to the emergency department for treatment and evaluation of itching to the site of her cesarean.  She has also had some light vaginal bleeding today.  Site of the cesarean appears to have healed well and there is no concern of cellulitis or dehiscence.  Itching is likely part of the healing process.  Patient feels reassured.  Lidocaine gel was applied to the area which may help with her symptoms.  She was also concerned about which soap she should be using and we discussed home care.  She declined a pelvic exam which seems reasonable as she tells me that the vaginal bleeding has been light and not even as heavy as  a normal menstrual period.  She is requesting to be discharged home now that she knows the cesarean site looks good.  Blood pressure is well controlled and she was encouraged to continue her current medications and keep her follow-up appointment as scheduled.     FINAL CLINICAL IMPRESSION(S) / ED DIAGNOSES   Final diagnoses:  Anxiety associated with cesarean section     Rx / DC Orders   ED Discharge Orders     None        Note:  This document was prepared using Dragon voice recognition software and may include unintentional dictation errors.   Chinita Pester, FNP 05/21/22 1147    Shaune Pollack, MD 05/21/22 2009

## 2022-05-21 NOTE — ED Notes (Signed)
Pt c/o vomiting and pain. Pt stated she "busted" her post c section stitches. Pt is crying in pain.

## 2022-06-04 ENCOUNTER — Emergency Department
Admission: EM | Admit: 2022-06-04 | Discharge: 2022-06-04 | Disposition: A | Discharge status: Home / Self Care | Payer: Medicaid Other | Attending: Emergency Medicine | Admitting: Emergency Medicine

## 2022-06-04 ENCOUNTER — Emergency Department: Payer: Medicaid Other

## 2022-06-04 ENCOUNTER — Other Ambulatory Visit: Payer: Self-pay

## 2022-06-04 DIAGNOSIS — R102 Pelvic and perineal pain: Secondary | ICD-10-CM | POA: Diagnosis not present

## 2022-06-04 DIAGNOSIS — Y69 Unspecified misadventure during surgical and medical care: Secondary | ICD-10-CM | POA: Diagnosis not present

## 2022-06-04 DIAGNOSIS — Z7982 Long term (current) use of aspirin: Secondary | ICD-10-CM | POA: Insufficient documentation

## 2022-06-04 DIAGNOSIS — Z87891 Personal history of nicotine dependence: Secondary | ICD-10-CM | POA: Insufficient documentation

## 2022-06-04 DIAGNOSIS — T8332XA Displacement of intrauterine contraceptive device, initial encounter: Secondary | ICD-10-CM | POA: Insufficient documentation

## 2022-06-04 LAB — POC URINE PREG, ED: Preg Test, Ur: NEGATIVE

## 2022-06-04 NOTE — ED Triage Notes (Signed)
Pt to ED POV from home, had cesarean birth 05/12/22 and had IUD placed at that time. Pt noticed yesterday that string is hanging out of vagina and pt is having lower abdominal cramping and lower back pain. Ambulatory, steady gait. Denies abnormal vaginal bleeding.

## 2022-06-04 NOTE — Discharge Instructions (Signed)
You presented to the ER today with complaint of concerned that your IUD was out of place.  The ultrasound shows that your IUD is malpositioned but still within the uterine cavity.  You need to call OB/GYN tomorrow and follow-up for further evaluation and treatment.  Please do not have sexual intercourse until you are evaluated by OB/GYN.

## 2022-06-04 NOTE — ED Provider Notes (Signed)
[facility] EMERGENCY DEPARTMENT Provider Note   CSN: [id number] Arrival date & time: 06/04/22  1136     History  Chief Complaint  Patient presents with   IUD hanging out    Julie Guzman is a 21 y.o. female G4 P0222 presents to the ER today with complaint of her IUD string hanging a significant length out of her vagina.  She noticed this yesterday.  She reports she is also started having vaginal bleeding, pelvic cramping and low back pain.  She had a C section on 05/12/2022.  She has not yet had her 6-week postpartum follow-up.     Home Medications Prior to Admission medications   Medication Sig Start Date End Date Taking? Authorizing Provider  aspirin EC 81 MG tablet Take 1 tablet (81 mg total) by mouth daily. Swallow whole. 04/24/22 04/24/23  Fisher, Roselyn Bering, PA-C  enoxaparin (LOVENOX) 40 MG/0.4ML injection Inject 40 mg into the skin every 12 (twelve) hours.    [provider]  miconazole (MICOTIN) 200 MG vaginal suppository Place 1 suppository (200 mg total) vaginally at bedtime. 04/27/22   Fisher, Roselyn Bering, PA-C  sertraline (ZOLOFT) 25 MG tablet Take 25 mg by mouth daily. 03/17/22   [provider]      Allergies    Patient has no known allergies.    Review of Systems   Review of Systems  Past Medical History:  Diagnosis Date   Headache    Infection    Pre-eclampsia affecting childbirth    Pregnancy induced hypertension    Preterm labor    Recurrent upper respiratory infection (URI)     No current facility-administered medications for this encounter.   Current Outpatient Medications  Medication Sig Dispense Refill   aspirin EC 81 MG tablet Take 1 tablet (81 mg total) by mouth daily. Swallow whole. 150 tablet 2   enoxaparin (LOVENOX) 40 MG/0.4ML injection Inject 40 mg into the skin every 12 (twelve) hours.     miconazole (MICOTIN) 200 MG vaginal suppository Place 1 suppository (200 mg total) vaginally at bedtime. 3 suppository 0    sertraline (ZOLOFT) 25 MG tablet Take 25 mg by mouth daily.      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  Tobacco Use   Smoking status: Former    Packs/day: 0.25    Types: Cigarettes    Passive exposure: Never   Smokeless tobacco: Never   Tobacco comments:    Haven't smoked in over one year  Vaping Use   Vaping Use: Former   Quit date: 09/27/2021   Substances: Nicotine, Flavoring  Substance and Sexual Activity   Alcohol use: Not Currently    Alcohol/week: 3.0 standard drinks of alcohol    Types: 3 Shots of liquor per week    Comment: stopped with pregnancy, May 2023   Drug use:  Yes    Frequency: 14.0 times per week    Types: Marijuana    Comment: 2 a day   Sexual activity: Yes    Birth control/protection: Condom  Other Topics Concern   Not on file  Social History Narrative   Mother watches child while at doctor visits. Lives with boyfriend (FOB) and brother.    Social Determinants of Health   Financial Resource Strain: Low Risk  (01/27/2022)   Overall Financial Resource Strain (CARDIA)    Difficulty of Paying Living Expenses: Not very hard  Food Insecurity: No Food Insecurity (01/27/2022)   Hunger Vital Sign    Worried About Running Out of Food in the Last Year: Never true    Ran Out of Food in the Last Year: Never true  Transportation Needs: No Transportation Needs (01/27/2022)   PRAPARE - Administrator, Civil Service (Medical): No    Lack of  Transportation (Non-Medical): No  Physical Activity: Not on file  Stress: Not on file  Social Connections: Unknown (01/27/2022)   Social Connection and Isolation Panel [NHANES]    Frequency of Communication with Friends and Family: More than three times a week    Frequency of Social Gatherings with Friends and Family: Not on file    Attends Religious Services: Not on file    Active Member of Clubs or Organizations: Not on file    Attends Banker Meetings: Not on file    Marital Status: Not on file  Intimate Partner Violence: Not on file     Constitutional: Denies fever, malaise, fatigue, headache or abrupt weight changes.  Respiratory: Denies difficulty breathing, shortness of breath, cough or sputum production.   Cardiovascular: Denies chest pain, chest tightness, palpitations or swelling in the hands or feet.  Gastrointestinal: Patient reports pelvic cramping.  Denies bloating, constipation, diarrhea or blood in the stool.  GU: Patient reports vaginal bleeding, IUD string hanging out of vagina.  Denies urgency, frequency, pain with urination, burning sensation, blood in urine, odor or discharge. Musculoskeletal: Patient reports low back pain.  Denies decrease in range of motion, difficulty with gait, or joint swelling.    No other specific complaints in a complete review of systems (except as listed in HPI above).  Physical Exam Updated Vital Signs BP 124/83 (BP Location: Right Arm)   Pulse 83   Temp 98.4 F (36.9 C) (Oral)   Resp 14   Ht 5\' 1"  (1.549 m)   Wt 98 kg   LMP 11/25/2021 (Approximate)   SpO2 98%   Breastfeeding No Comment: gave birth c/s 05/12/22  BMI 40.81 kg/m  Physical Exam   Wt Readings from Last 3 Encounters:  06/04/22 98 kg  05/21/22 131.5 kg  05/16/22 125 kg    General: Appears her stated age, obese, in NAD. Cardiovascular: Normal rate and rhythm. S1,S2 noted.  No murmur, rubs or gallops noted.  Pulmonary/Chest: Normal effort and  positive vesicular breath sounds. No respiratory distress. No wheezes, rales or ronchi noted.  Abdomen: Soft and nontender. Normal bowel sounds. No distention or masses noted.  Pelvic: Normal female anatomy.  IUD strings noted coming through the cervical os.  The tip of the IUD was not noticed past the cervix. Neurological: Alert and oriented.     ED Results / Procedures / Treatments    Radiology  Imaging Orders         US PELVIC COMPLETE WITH TRANSVAGINAL    IMPRESSION: 1. Intrauterine device is located within the  endometrial canal at the lower uterine segment. 2. Previously seen intrauterine gestational sac containing fetus is no longer present. 3. Otherwise unremarkable ultrasound of the uterus and ovaries.   Medications Ordered in ED Medications - No data to display  ED Course/ Medical Decision Making/ A&P   IUD Concern Vaginal Bleeding, Pelvic Cramping and Low Back Pain:  DDx include IUD dislodgment, IUD malposition Urine pregnancy: Negative Pelvic/transvaginal ultrasound shows malposition of her IUD but still within the uterine cavity Discussed this finding with patient, advised her that we do not pull out IUDs in the ER. Advised her to follow-up with her OB/GYN tomorrow for further evaluation and treatment  Final Clinical Impression(s) / ED Diagnoses Final diagnoses:  Malpositioned intrauterine device (IUD), initial encounter    Rx / DC Orders ED Discharge Orders     None         Lorre Munroe, NP 06/04/22 1653    Chesley Noon, MD 06/07/22 [phone]

## 2022-10-02 IMAGING — US US OB < 14 WEEKS - US OB TV
1 series · 14 of 28 positions shown · non-contrast
Comparison: 09/18/2018

CLINICAL DATA: Pelvic pain

EXAM:
OBSTETRIC <14 WK US AND TRANSVAGINAL OB US
TECHNIQUE: Both transabdominal and transvaginal ultrasound examinations were
performed for complete evaluation of the gestation as well as the
maternal uterus, adnexal regions, and pelvic cul-de-sac.
Transvaginal technique was performed to assess early pregnancy.

[Series 1: us ob comp less 14 wks · 14 of 111 slices shown]
[im 5/111]
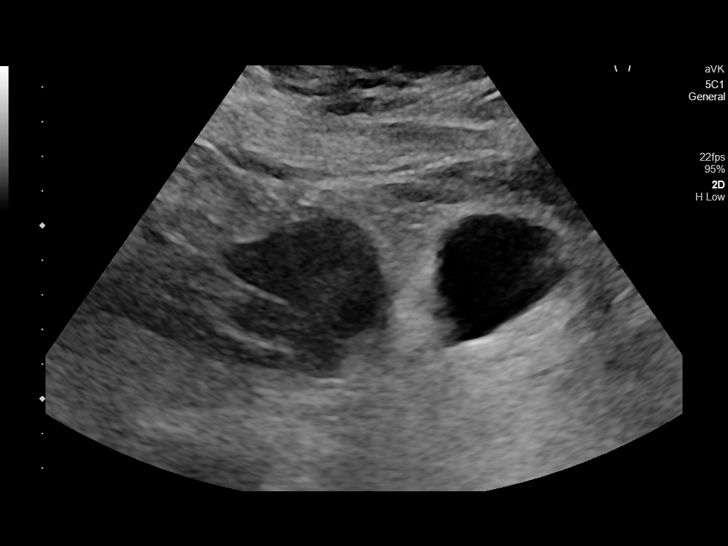
[im 13/111]
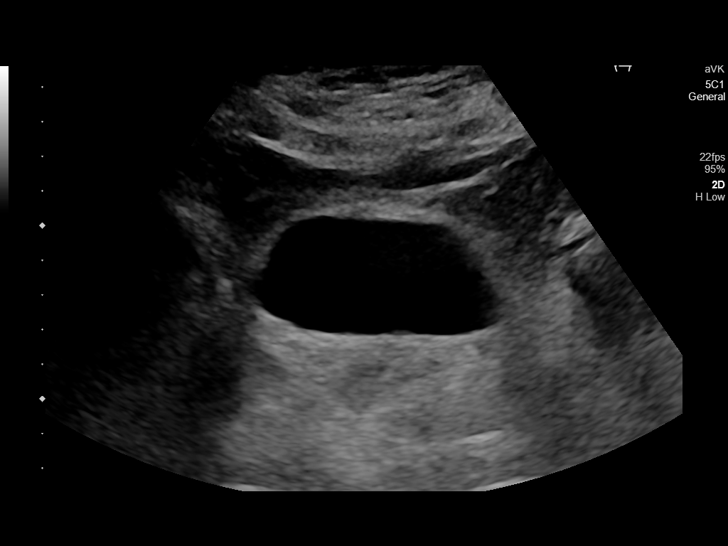
[im 21/111]
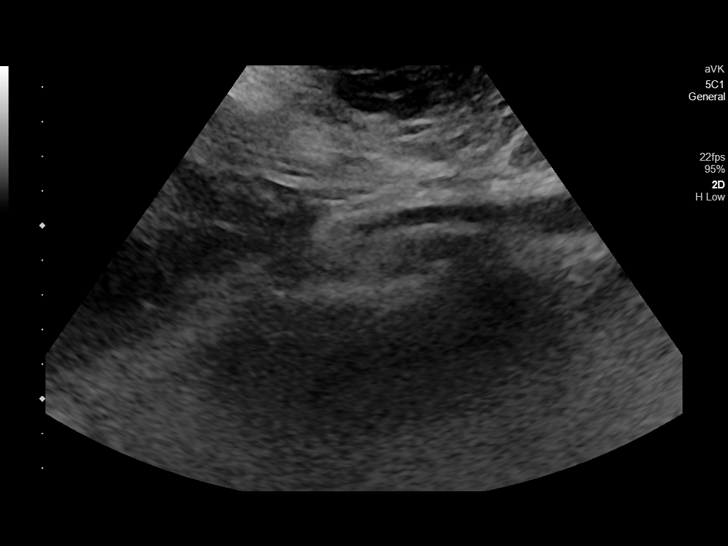
[im 29/111]
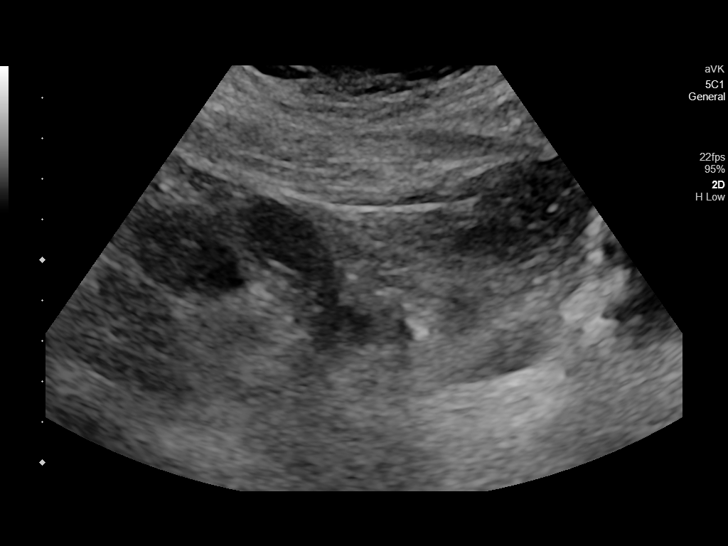
[im 37/111]
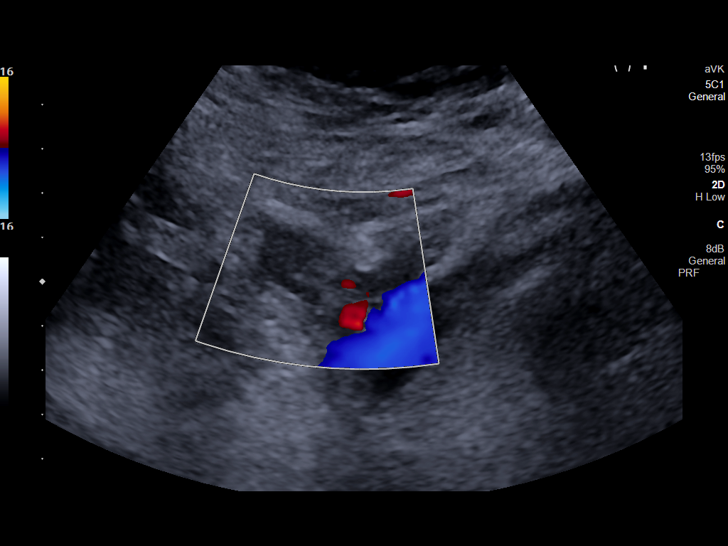
[im 45/111]
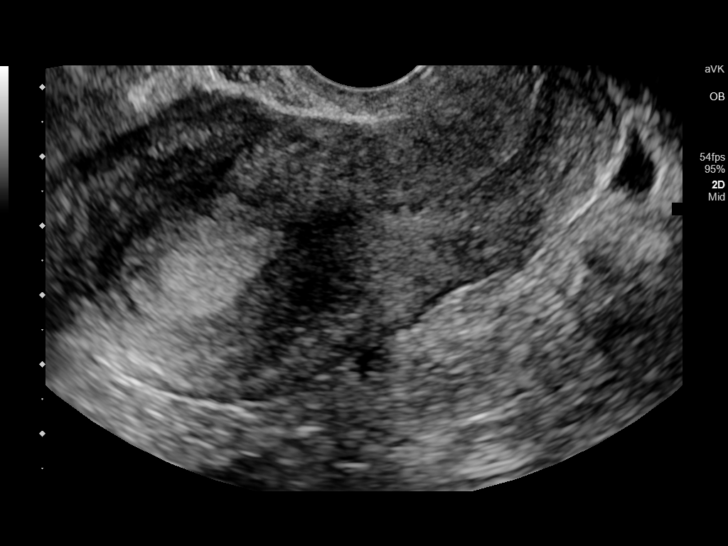
[im 53/111]
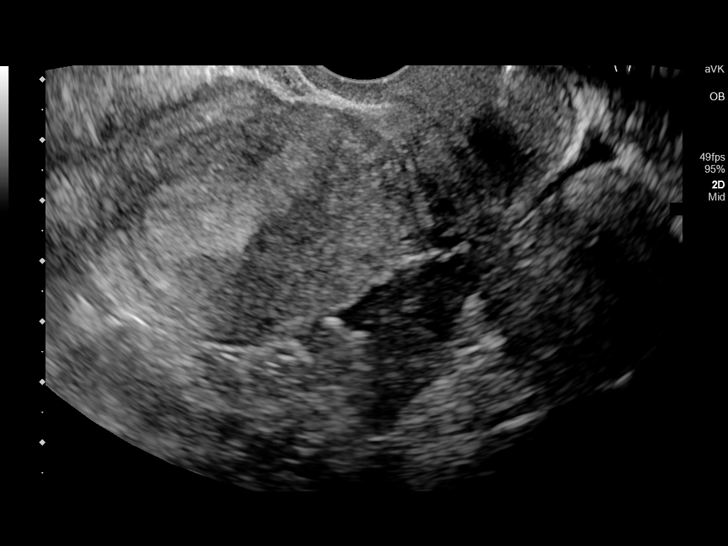
[im 62/111]
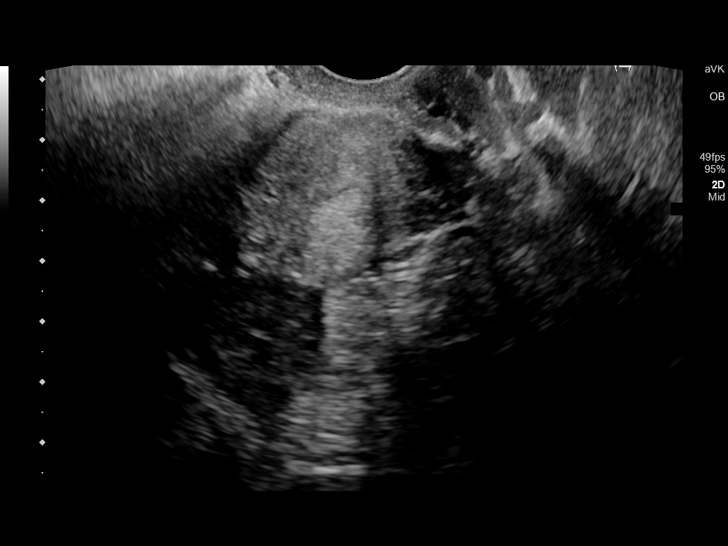
[im 70/111]
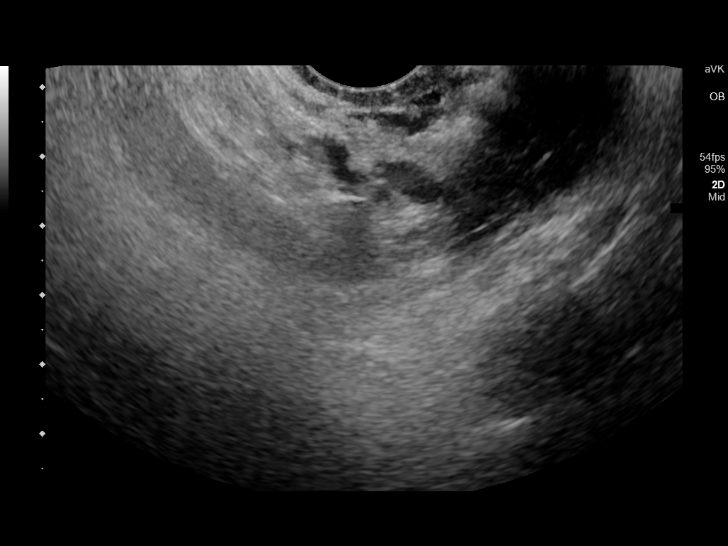
[im 78/111]
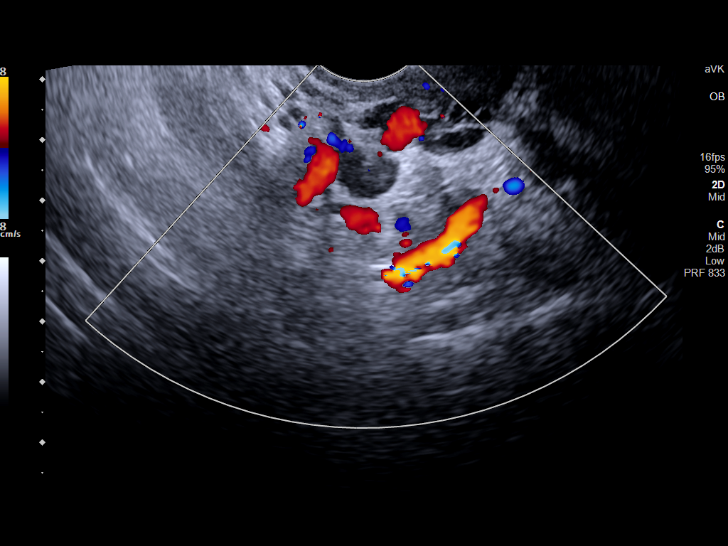
[im 86/111]
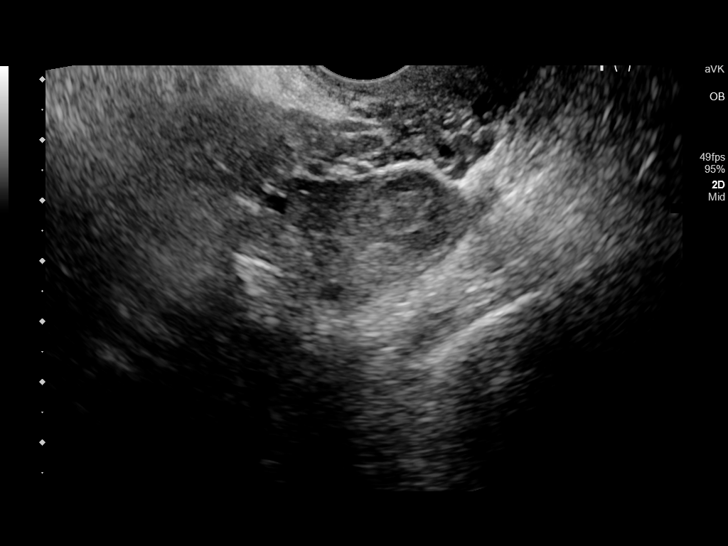
[im 94/111]
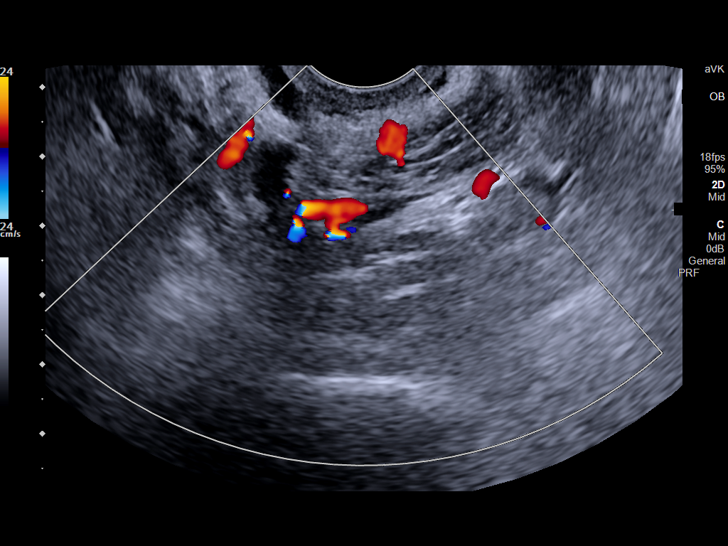
[im 102/111]
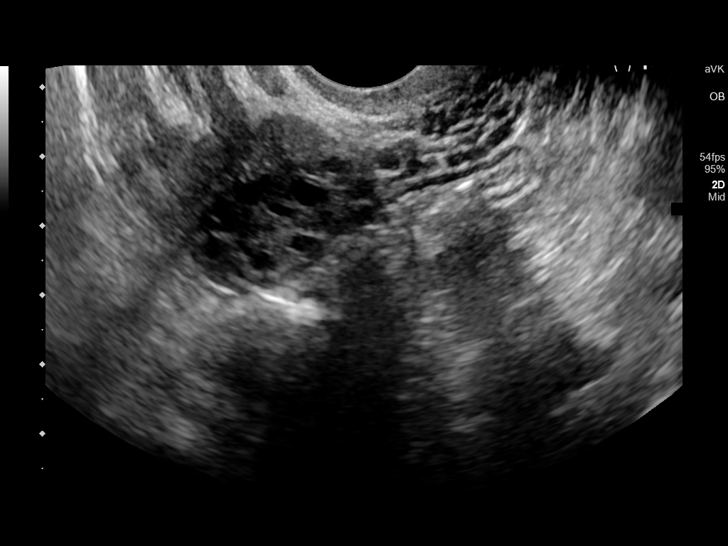
[im 111/111]
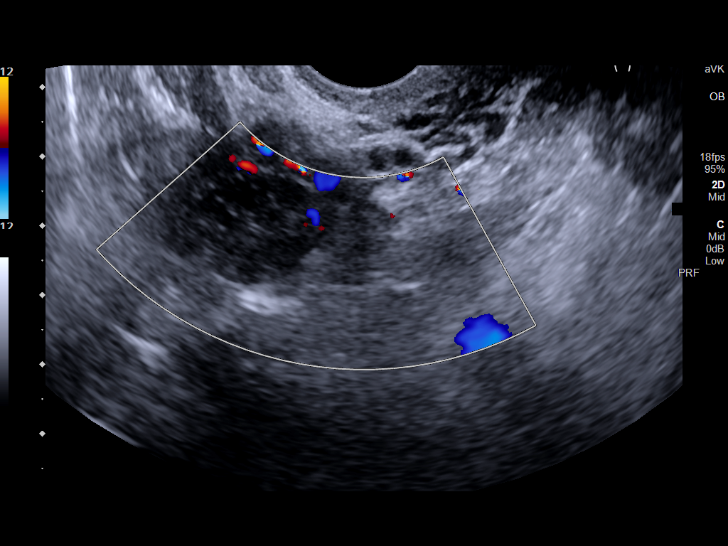

[14 of 28 positions shown; findings below may reference images not displayed]

FINDINGS: Intrauterine gestational sac: None

Yolk sac:  Not seen

Embryo:  Not seen

Cardiac Activity: Not seen

Subchorionic hemorrhage:  None visualized.

Maternal uterus/adnexae: There is prominence of endometrial stripe
within the fundus without any fluid collections. Ovaries are
essentially unremarkable. Trace amount of free fluid is seen in the
left side of pelvis.
IMPRESSION: There is no demonstrable intrauterine gestational sac. This may
suggest very early IUP or failed gestation with complete abortion or
ectopic gestation. Serial HCG estimations and follow-up sonogram as
warranted should be considered.

There are no adnexal masses. Trace amount of free fluid in the left
side [DATE] be due to recent rupture of ovarian cyst or
follicle.

## 2022-10-04 IMAGING — CR DG CHEST 2V
2 series · 2 of 2 positions shown · non-contrast
Comparison: May 04, 2021

CLINICAL DATA: Shortness of breath.

EXAM:
CHEST - 2 VIEW

[chest pa]
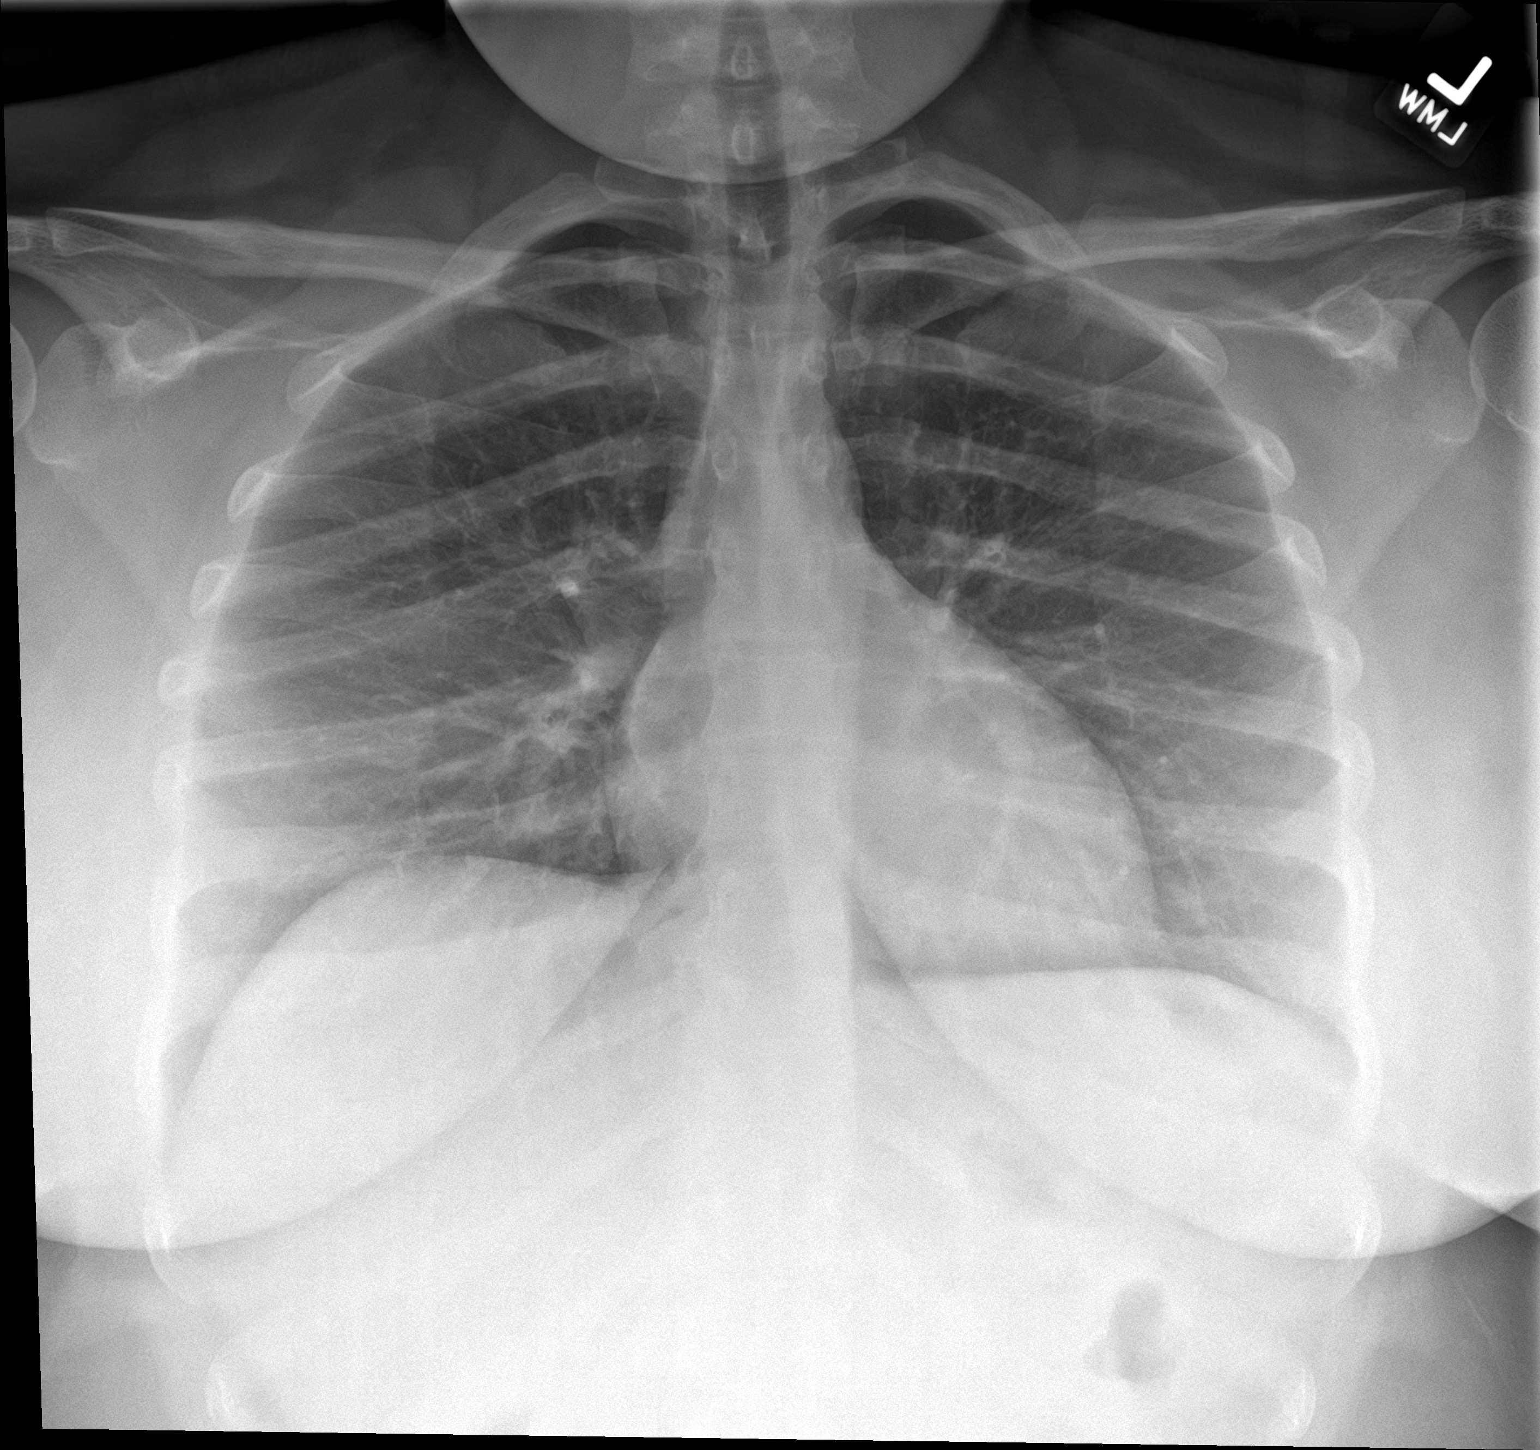

[chest lat]
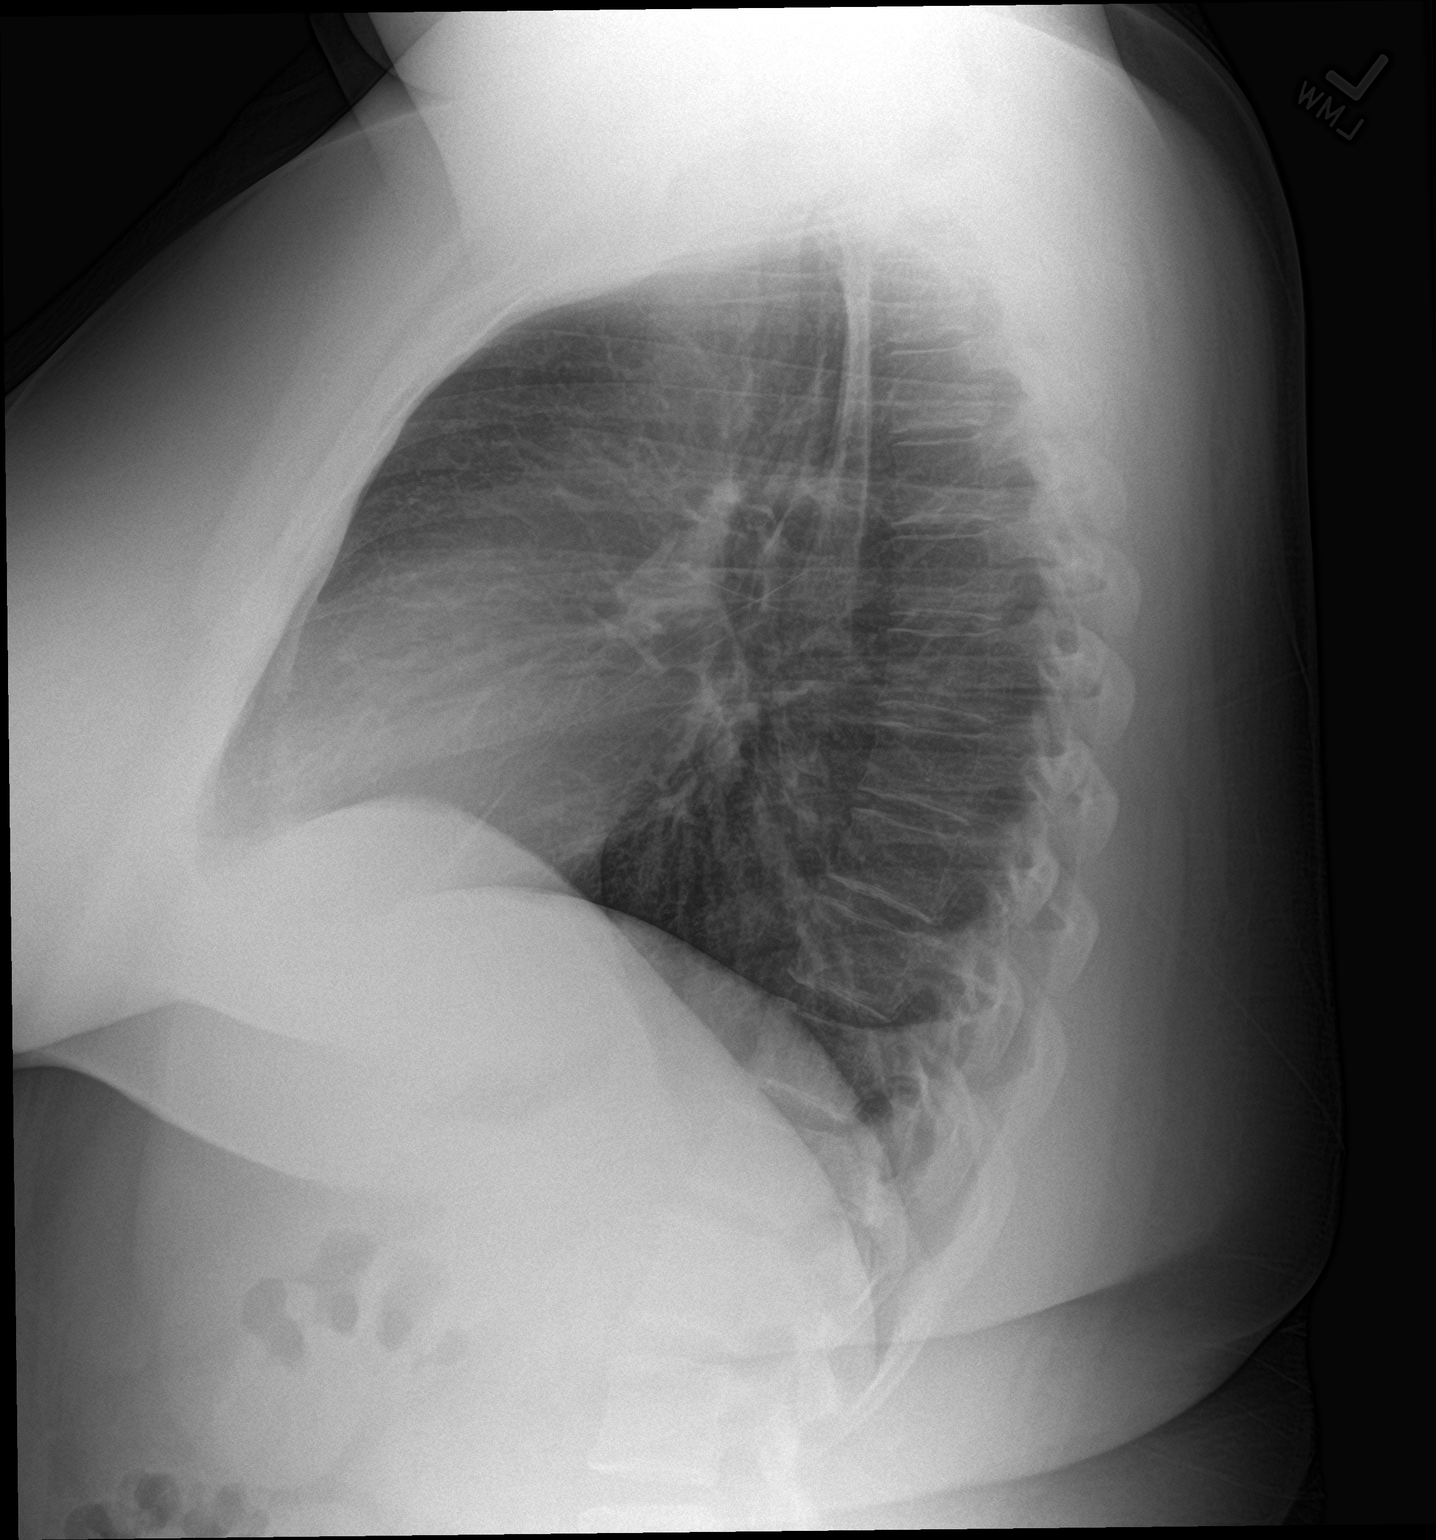

[2 of 2 positions shown; findings below may reference images not displayed]

FINDINGS: The heart size and mediastinal contours are within normal limits.
Both lungs are clear. The visualized skeletal structures are
unremarkable.
IMPRESSION: No active cardiopulmonary disease.

## 2022-10-04 IMAGING — US US EXTREM LOW VENOUS
1 series · 13 of 24 positions shown · non-contrast
Comparison: None Available.

CLINICAL DATA: Bilateral lower extremity swelling.



[Series 1: us venous img lower bilat (dvt) · portal-venous · 13 of 56 slices shown]
[im 1/56]
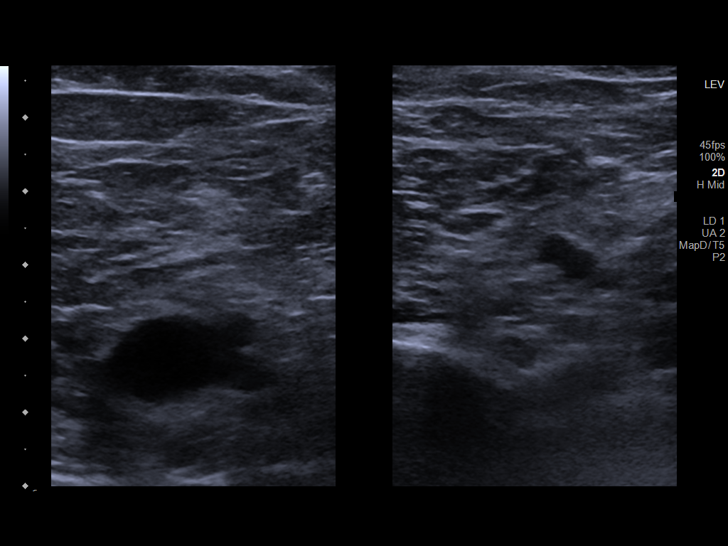
[im 5/56]
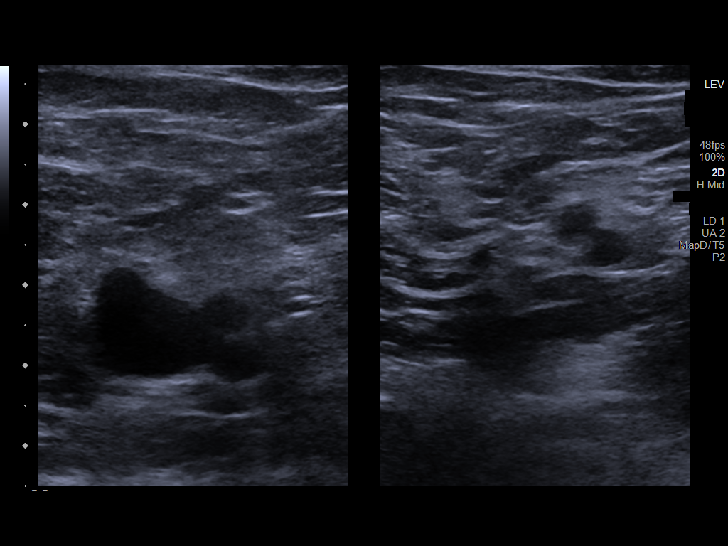
[im 10/56]
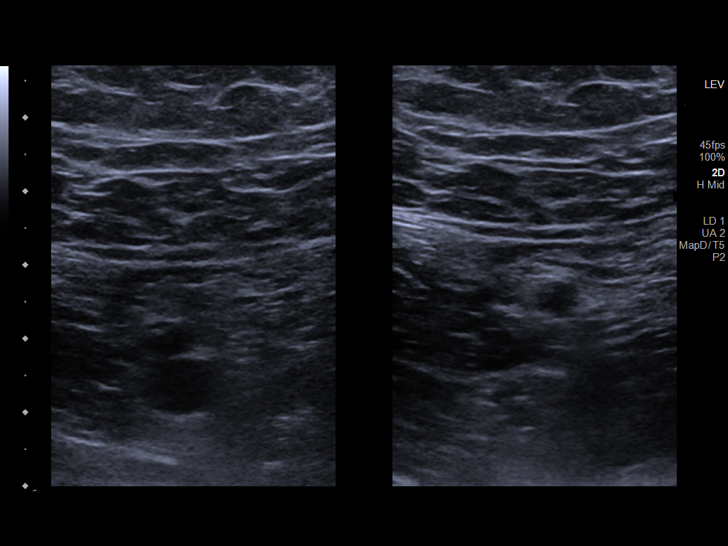
[im 15/56]
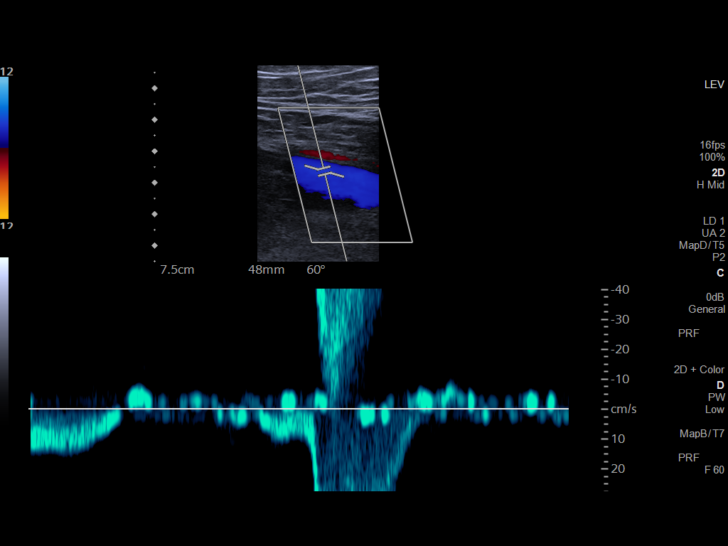
[im 20/56]
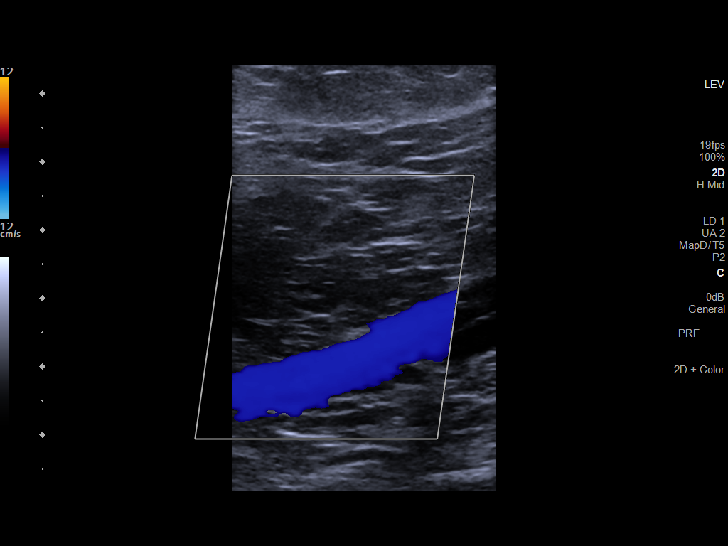
[im 24/56]
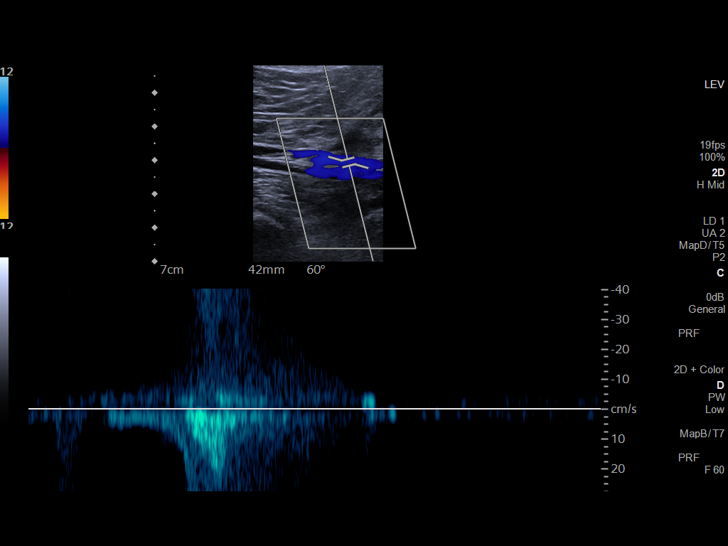
[im 29/56]
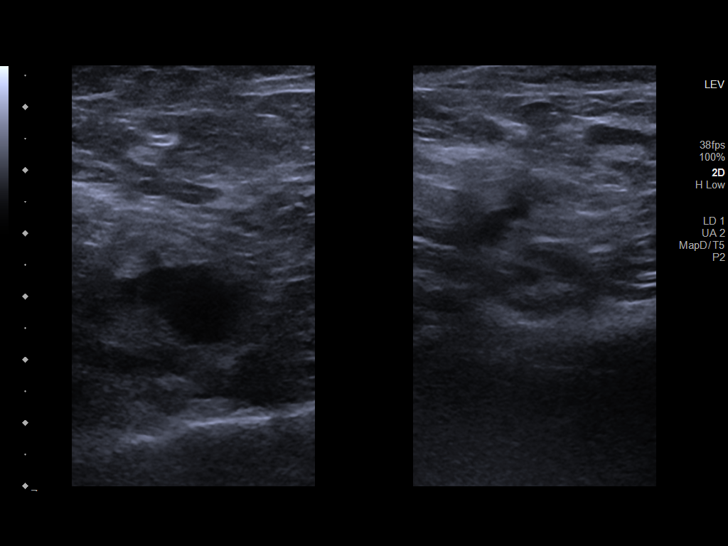
[im 32/56]
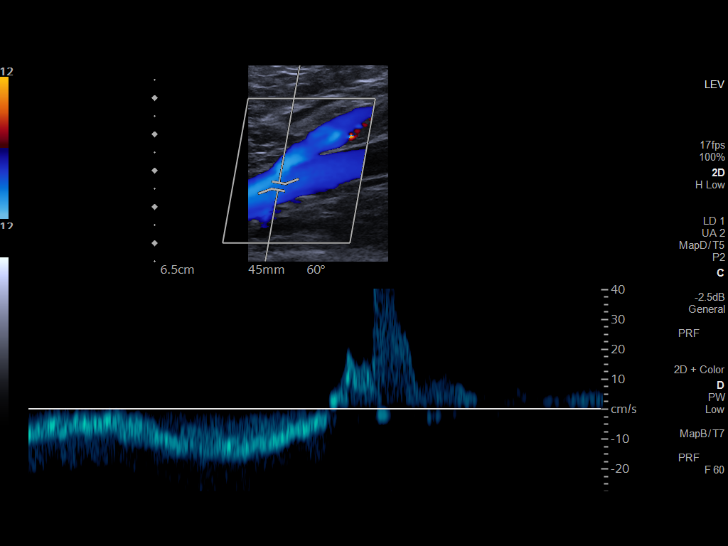
[im 36/56]
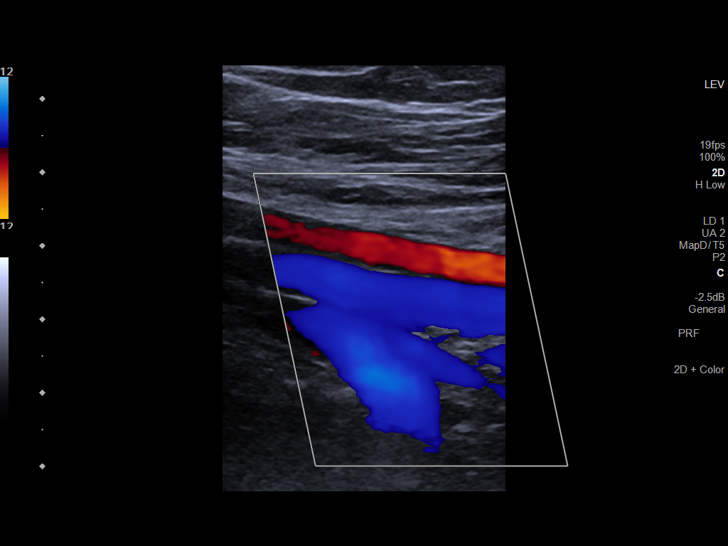
[im 41/56]
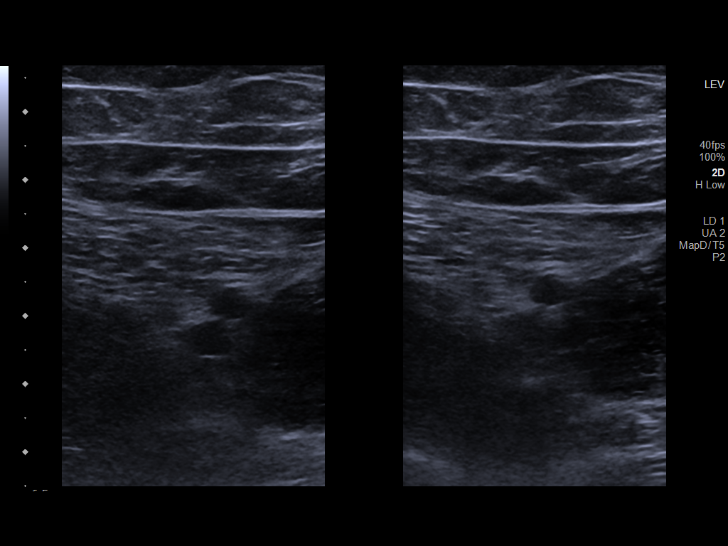
[im 46/56]
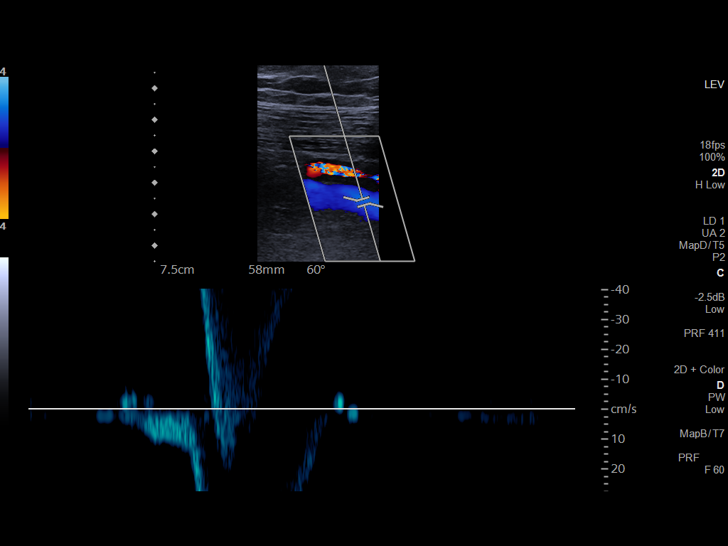
[im 51/56]
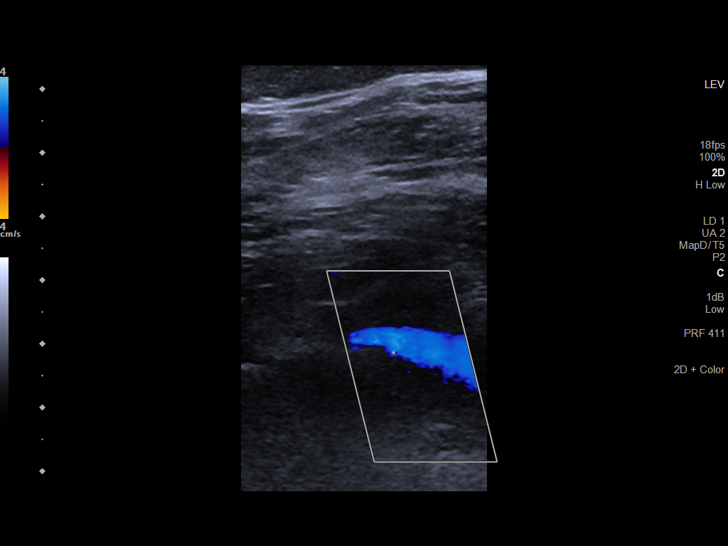
[im 56/56]
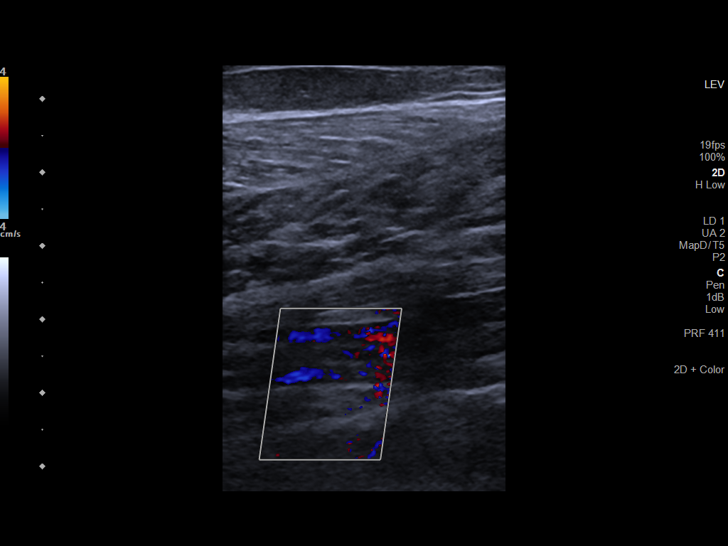

[13 of 24 positions shown; findings below may reference images not displayed]

FINDINGS: RIGHT LOWER EXTREMITY

Common Femoral Vein: No evidence of thrombus. Normal
compressibility, respiratory phasicity and response to augmentation.

Saphenofemoral Junction: No evidence of thrombus. Normal
compressibility and flow on color Doppler imaging.

Profunda Femoral Vein: No evidence of thrombus. Normal
compressibility and flow on color Doppler imaging.

Femoral Vein: No evidence of thrombus. Normal compressibility,
respiratory phasicity and response to augmentation.

Popliteal Vein: No evidence of thrombus. Normal compressibility,
respiratory phasicity and response to augmentation.

Calf Veins: No evidence of thrombus. Normal compressibility and flow
on color Doppler imaging.

Venous Reflux:  None.

Other Findings:  None.

LEFT LOWER EXTREMITY

Common Femoral Vein: No evidence of thrombus. Normal
compressibility, respiratory phasicity and response to augmentation.

Saphenofemoral Junction: No evidence of thrombus. Normal
compressibility and flow on color Doppler imaging.

Profunda Femoral Vein: No evidence of thrombus. Normal
compressibility and flow on color Doppler imaging.

Femoral Vein: No evidence of thrombus. Normal compressibility,
respiratory phasicity and response to augmentation.

Popliteal Vein: No evidence of thrombus. Normal compressibility,
respiratory phasicity and response to augmentation.

Calf Veins: No evidence of thrombus. Normal compressibility and flow
on color Doppler imaging.

Venous Reflux:  None.

Other Findings:  None.
IMPRESSION: No evidence of deep venous thrombosis in either lower extremity.

## 2023-02-08 ENCOUNTER — Ambulatory Visit: Payer: Medicaid Other

## 2023-02-22 ENCOUNTER — Other Ambulatory Visit: Payer: Self-pay

## 2023-02-22 ENCOUNTER — Ambulatory Visit: Payer: Medicaid Other | Admitting: Physician Assistant
